# Patient Record
Sex: Female | Born: 1990 | Race: White | Hispanic: No | Marital: Single | Smoking: Former smoker
Health system: Southern US, Community
[De-identification: ages and names within clinical notes are randomized; demographics above are authoritative.]

## PROBLEM LIST (undated history)

## (undated) DIAGNOSIS — Z8489 Family history of other specified conditions: Secondary | ICD-10-CM

## (undated) DIAGNOSIS — J309 Allergic rhinitis, unspecified: Secondary | ICD-10-CM

## (undated) DIAGNOSIS — F329 Major depressive disorder, single episode, unspecified: Secondary | ICD-10-CM

## (undated) DIAGNOSIS — F32A Depression, unspecified: Secondary | ICD-10-CM

## (undated) DIAGNOSIS — N83209 Unspecified ovarian cyst, unspecified side: Secondary | ICD-10-CM

## (undated) DIAGNOSIS — E785 Hyperlipidemia, unspecified: Secondary | ICD-10-CM

## (undated) DIAGNOSIS — K219 Gastro-esophageal reflux disease without esophagitis: Secondary | ICD-10-CM

## (undated) DIAGNOSIS — F988 Other specified behavioral and emotional disorders with onset usually occurring in childhood and adolescence: Secondary | ICD-10-CM

## (undated) DIAGNOSIS — R569 Unspecified convulsions: Secondary | ICD-10-CM

## (undated) DIAGNOSIS — F419 Anxiety disorder, unspecified: Secondary | ICD-10-CM

## (undated) DIAGNOSIS — F209 Schizophrenia, unspecified: Secondary | ICD-10-CM

## (undated) HISTORY — DX: Major depressive disorder, single episode, unspecified: F32.9

## (undated) HISTORY — DX: Allergic rhinitis, unspecified: J30.9

## (undated) HISTORY — DX: Other specified behavioral and emotional disorders with onset usually occurring in childhood and adolescence: F98.8

## (undated) HISTORY — PX: NO PAST SURGERIES: SHX2092

## (undated) HISTORY — DX: Anxiety disorder, unspecified: F41.9

## (undated) HISTORY — DX: Depression, unspecified: F32.A

## (undated) HISTORY — PX: MRI: SHX5353

---
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
## 2008-05-30 ENCOUNTER — Emergency Department (HOSPITAL_COMMUNITY): Admission: EM | Admit: 2008-05-30 | Discharge: 2008-05-30 | Payer: Self-pay | Admitting: Emergency Medicine

## 2009-05-11 ENCOUNTER — Emergency Department (HOSPITAL_COMMUNITY): Admission: EM | Admit: 2009-05-11 | Discharge: 2009-05-11 | Payer: Self-pay | Admitting: Emergency Medicine
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
## 2010-06-11 ENCOUNTER — Encounter: Payer: Self-pay | Admitting: Pediatrics

## 2010-08-06 ENCOUNTER — Emergency Department (HOSPITAL_COMMUNITY)
Admission: EM | Admit: 2010-08-06 | Discharge: 2010-08-07 | Disposition: A | Discharge status: Home / Self Care | Payer: Managed Care, Other (non HMO) | Attending: Emergency Medicine | Admitting: Emergency Medicine

## 2010-08-06 DIAGNOSIS — IMO0001 Reserved for inherently not codable concepts without codable children: Secondary | ICD-10-CM | POA: Insufficient documentation

## 2010-08-06 DIAGNOSIS — R112 Nausea with vomiting, unspecified: Secondary | ICD-10-CM | POA: Insufficient documentation

## 2010-08-06 DIAGNOSIS — F341 Dysthymic disorder: Secondary | ICD-10-CM | POA: Insufficient documentation

## 2010-08-06 LAB — POCT I-STAT, CHEM 8
BUN: 16 mg/dL (ref 6–23)
Calcium, Ion: 1.09 mmol/L — ABNORMAL LOW (ref 1.12–1.32)
Chloride: 105 mEq/L (ref 96–112)
Creatinine, Ser: 0.7 mg/dL (ref 0.4–1.2)
Glucose, Bld: 116 mg/dL — ABNORMAL HIGH (ref 70–99)
HCT: 47 % — ABNORMAL HIGH (ref 36.0–46.0)
Hemoglobin: 16 g/dL — ABNORMAL HIGH (ref 12.0–15.0)
Potassium: 4 mEq/L (ref 3.5–5.1)
Sodium: 139 mEq/L (ref 135–145)
TCO2: 22 mmol/L (ref 0–100)

## 2010-08-06 LAB — URINALYSIS, ROUTINE W REFLEX MICROSCOPIC
Bilirubin Urine: NEGATIVE
Glucose, UA: NEGATIVE mg/dL
Hgb urine dipstick: NEGATIVE
Ketones, ur: NEGATIVE mg/dL
Nitrite: NEGATIVE
Protein, ur: NEGATIVE mg/dL
Specific Gravity, Urine: 1.022 (ref 1.005–1.030)
Urobilinogen, UA: 0.2 mg/dL (ref 0.0–1.0)
pH: 5 (ref 5.0–8.0)

## 2010-08-06 LAB — PREGNANCY, URINE: Preg Test, Ur: NEGATIVE

## 2010-08-06 LAB — URINE MICROSCOPIC-ADD ON

## 2010-08-22 LAB — URINALYSIS, ROUTINE W REFLEX MICROSCOPIC
Bilirubin Urine: NEGATIVE
Glucose, UA: NEGATIVE mg/dL
Ketones, ur: NEGATIVE mg/dL
Nitrite: NEGATIVE
Protein, ur: NEGATIVE mg/dL
Specific Gravity, Urine: 1.011 (ref 1.005–1.030)
Urobilinogen, UA: 0.2 mg/dL (ref 0.0–1.0)
pH: 6.5 (ref 5.0–8.0)

## 2010-08-22 LAB — URINE MICROSCOPIC-ADD ON

## 2010-08-22 LAB — POCT PREGNANCY, URINE: Preg Test, Ur: NEGATIVE

## 2010-09-05 LAB — URINE MICROSCOPIC-ADD ON

## 2010-09-05 LAB — URINALYSIS, ROUTINE W REFLEX MICROSCOPIC
Glucose, UA: NEGATIVE mg/dL
Ketones, ur: 40 mg/dL — AB
Leukocytes, UA: NEGATIVE
Nitrite: NEGATIVE
Protein, ur: 30 mg/dL — AB
Specific Gravity, Urine: 1.037 — ABNORMAL HIGH (ref 1.005–1.030)
Urobilinogen, UA: 0.2 mg/dL (ref 0.0–1.0)
pH: 5.5 (ref 5.0–8.0)

## 2010-09-05 LAB — PREGNANCY, URINE: Preg Test, Ur: NEGATIVE

## 2013-10-08 ENCOUNTER — Encounter (HOSPITAL_COMMUNITY): Payer: Self-pay | Admitting: Emergency Medicine

## 2013-10-08 ENCOUNTER — Emergency Department (HOSPITAL_COMMUNITY)
Admission: EM | Admit: 2013-10-08 | Discharge: 2013-10-08 | Disposition: A | Discharge status: Home / Self Care | Payer: Managed Care, Other (non HMO) | Attending: Emergency Medicine | Admitting: Emergency Medicine

## 2013-10-08 ENCOUNTER — Emergency Department (HOSPITAL_COMMUNITY)
Admission: EM | Admit: 2013-10-08 | Discharge: 2013-10-08 | Discharge status: Against Medical Advice | Payer: Managed Care, Other (non HMO) | Attending: Emergency Medicine | Admitting: Emergency Medicine

## 2013-10-08 DIAGNOSIS — Z792 Long term (current) use of antibiotics: Secondary | ICD-10-CM | POA: Insufficient documentation

## 2013-10-08 DIAGNOSIS — F411 Generalized anxiety disorder: Secondary | ICD-10-CM | POA: Insufficient documentation

## 2013-10-08 DIAGNOSIS — H9209 Otalgia, unspecified ear: Secondary | ICD-10-CM

## 2013-10-08 DIAGNOSIS — H6090 Unspecified otitis externa, unspecified ear: Secondary | ICD-10-CM

## 2013-10-08 DIAGNOSIS — F17200 Nicotine dependence, unspecified, uncomplicated: Secondary | ICD-10-CM | POA: Insufficient documentation

## 2013-10-08 DIAGNOSIS — H9319 Tinnitus, unspecified ear: Secondary | ICD-10-CM | POA: Insufficient documentation

## 2013-10-08 DIAGNOSIS — H60399 Other infective otitis externa, unspecified ear: Secondary | ICD-10-CM | POA: Insufficient documentation

## 2013-10-08 MED ORDER — ANTIPYRINE-BENZOCAINE 5.4-1.4 % OT SOLN: 3.0000 [drp] | OTIC | Status: DC | PRN

## 2013-10-08 MED ORDER — NEOMYCIN-POLYMYXIN-HC 3.5-10000-1 OT SUSP: 4.0000 [drp] | Freq: Three times a day (TID) | OTIC | Status: DC

## 2013-10-08 NOTE — ED Provider Notes (Signed)
CSN: [id number]     Arrival date & time 10/08/13  2030 History  This chart was scribed for non-physician practitioner Earley FavorGail Schulz, NP,  working with Layla MawKristen N Ward, DO, by Yevette EdwardsAngela Bracken, ED Scribe. This patient was seen in room [facility]1/[facility]1 and the patient's care was started at 9:24 PM.   First MD Initiated Contact with Patient 10/08/13 2124     Chief Complaint  Patient presents with  . Otalgia    The history is provided by the patient. No language interpreter was used.   HPI Comments: Margaret Farley is a 23 y.o. female who presents to the Emergency Department complaining of left-sided otalgia. She was treated in the ED previously today for the same symptoms; her ear was irrigated and she was prescribed medication. The pt returns requesting an x-ray of her ear.   History reviewed. No pertinent past medical history. History reviewed. No pertinent past surgical history. No family history on file. History  Substance Use Topics  . Smoking status: Current Every Day Smoker -- 0.50 packs/day    Types: Cigarettes  . Smokeless tobacco: Not on file  . Alcohol Use: No   No OB history provided.  Review of Systems  HENT: Positive for ear pain. Negative for ear discharge.     Allergies  Review of patient's allergies indicates no known allergies.  Home Medications   Prior to Admission medications   Medication Sig Start Date End Date Taking? Authorizing Provider  antipyrine-benzocaine [name](AURALGAN) otic solution Place 3-4 drops into the left ear every 2 (two) hours as needed for ear pain. 10/08/13   Renne CriglerJoshua Geiple, PA-C  neomycin-polymyxin-hydrocortisone (CORTISPORIN) 3.5-10000-1 otic suspension Place 4 drops into the left ear 3 (three) times daily. 10/08/13   Renne CriglerJoshua Geiple, PA-C   Triage Vitals: BP 137/87  Pulse 97  Temp(Src) 98.3 F (36.8 C) (Oral)  Resp 16  SpO2 99%  LMP 09/14/2013  Physical Exam  Nursing note and vitals reviewed. Constitutional: She is oriented to person, place, and  time. She appears well-developed and well-nourished. No distress.  HENT:  Head: Normocephalic and atraumatic.  Right Ear: External ear normal. No drainage, swelling or tenderness. No foreign bodies. No mastoid tenderness. Tympanic membrane is not injected and not perforated.  Left Ear: External ear normal. No drainage, swelling or tenderness. No foreign bodies. No mastoid tenderness. Tympanic membrane is not injected, not perforated, not erythematous, not retracted and not bulging.  Eyes: EOM are normal.  Neck: Neck supple. No tracheal deviation present.  Cardiovascular: Normal rate.   Pulmonary/Chest: Effort normal. No respiratory distress.  Musculoskeletal: Normal range of motion.  Neurological: She is alert and oriented to person, place, and time.  Skin: Skin is warm and dry.  Psychiatric: She has a normal mood and affect. Her behavior is normal.    ED Course  Procedures (including critical care time)  DIAGNOSTIC STUDIES: Oxygen Saturation is 99% on room air, normal by my interpretation.    COORDINATION OF CARE:  9:28 PM- Discussed treatment plan with patient, and the pt expressed frustration that her ear would not be imaged. She became angry during the interview when it was explained to her that her symptoms did not warrant imaging.   9:30 PM- The pt's mother requested a psych consult for the pt, but the pt left before treatment.   Labs Review Labs Reviewed - No data to display  Imaging Review No results found.   EKG Interpretation None      MDM   Final diagnoses:  Otalgia       I personally performed the services described in this documentation, which was scribed in my presence. The recorded information has been reviewed and is accurate.     Arman FilterGail K Schulz, NP 10/08/13 2136  Arman FilterGail K Schulz, NP 10/08/13 2136

## 2013-10-08 NOTE — ED Provider Notes (Signed)
Medical screening examination/treatment/procedure(s) were performed by non-physician practitioner and as supervising physician I was immediately available for consultation/collaboration.   EKG Interpretation None       Kristen N Ward, DO 10/08/13 2325 

## 2013-10-08 NOTE — ED Notes (Signed)
Per pt report: pt was seen earlier for same complaint. Pt is here for an x-ray.  Pt reports there is something in left ear. Pt a/o x 4.  Skin warm and dry. NAD noted.  Ambulatory in triage.

## 2013-10-08 NOTE — ED Notes (Signed)
Pt left prior to receiving any discharge instructions.

## 2013-10-08 NOTE — ED Notes (Signed)
Pt states she has had ringing in bilateral ears x 3 wks.  Thinks there may be something in them.  Has used tweezers and dunked her head underwater to try to alleviate it.

## 2013-10-08 NOTE — Discharge Instructions (Signed)
Please read and follow all provided instructions.  Your diagnoses today include:  1. Otitis externa    Tests performed today include:  Vital signs. See below for your results today.   Medications prescribed:   Antipyrine/Benzocaine eardrops - Use 2-4 drops in painful ear every 6 hours as needed   Cortisporin ear drops - drops for external ear infection  Take any prescribed medications only as directed. Treatment for your infection is aimed at treating the symptoms. There are no medications, such as antibiotics, that will cure your infection.   Home care instructions:  Follow any educational materials contained in this packet.   Your illness is contagious and can be spread to others, especially during the first 3 or 4 days. It cannot be cured by antibiotics or other medicines. Take basic precautions such as washing your hands often, covering your mouth when you cough or sneeze, and avoiding public places where you could spread your illness to others.   Please continue drinking plenty of fluids.  Use over-the-counter medicines as needed as directed on packaging for symptom relief.  You may also use ibuprofen or tylenol as directed on packaging for pain or fever.  Do not take multiple medicines containing Tylenol or acetaminophen to avoid taking too much of this medication.  Follow-up instructions: Please follow-up with your primary care provider or the ear doctor listed in the next 7 days for further evaluation of your symptoms if you are not feeling better. If you do not have a primary care doctor -- see below for referral information.   Return instructions:   Please return to the Emergency Department if you experience worsening symptoms.   RETURN IMMEDIATELY IF you develop shortness of breath, confusion or altered mental status, a new rash, become dizzy, faint, or poorly responsive, or are unable to be cared for at home.  Please return if you have persistent vomiting and cannot keep  down fluids or develop a fever that is not controlled by tylenol or motrin.    Please return if you have any other emergent concerns.  Additional Information:  Your vital signs today were: BP 130/83   Pulse 79   Temp(Src) 98.3 F (36.8 C) (Oral)   Resp 20   SpO2 100% If your blood pressure (BP) was elevated above 135/85 this visit, please have this repeated by your doctor within one month. --------------

## 2013-10-08 NOTE — ED Provider Notes (Signed)
CSN: [id number]     Arrival date & time 10/08/13  1554 History   First MD Initiated Contact with Patient 10/08/13 1733     Chief Complaint  Patient presents with  . Ear Problem     (Consider location/radiation/quality/duration/timing/severity/associated sxs/prior Treatment) HPI Comments: Patient presents with complaint of tinnitus, left ear pain and foreign body sensation since the last week of February 2015. Patient denies injury or getting anything in her ear. She doesn't know what could be and then. Patient denies to me, any actions to improve the symptoms. She denies using Q-tips or putting anything in her ears because she is afraid. She denies URI symptoms, fever. Onset of symptoms gradual. Course is gradually worsening. Nothing makes symptoms better or worse.  The history is provided by the patient.    History reviewed. No pertinent past medical history. No past surgical history on file. No family history on file. History  Substance Use Topics  . Smoking status: Not on file  . Smokeless tobacco: Not on file  . Alcohol Use: Not on file   OB History   Grav Para Term Preterm Abortions TAB SAB Ect Mult Living                 Review of Systems  Constitutional: Negative for fever and chills.  HENT: Positive for ear pain and tinnitus. Negative for congestion, ear discharge, rhinorrhea, sinus pressure and sore throat.   Eyes: Negative for redness.  Gastrointestinal: Negative for nausea and vomiting.  Musculoskeletal: Negative for neck stiffness.  Skin: Negative for rash.  Neurological: Negative for headaches.  Hematological: Negative for adenopathy.   Allergies  Review of patient's allergies indicates no known allergies.  Home Medications   Prior to Admission medications   Not on File   BP 130/83  Pulse 79  Temp(Src) 98.3 F (36.8 C) (Oral)  Resp 20  SpO2 100%  Physical Exam  Nursing note and vitals reviewed. Constitutional: She appears well-developed and  well-nourished.  HENT:  Head: Normocephalic and atraumatic.  Right Ear: Tympanic membrane and ear canal normal. There is tenderness. No drainage or swelling. No foreign bodies. Tympanic membrane is not injected, not perforated and not erythematous. No decreased hearing is noted.  Left Ear: Tympanic membrane and ear canal normal. There is tenderness. No drainage or swelling. No foreign bodies. Tympanic membrane is not injected, not perforated and not erythematous. Decreased hearing is noted.  Nose: Nose normal. No mucosal edema or rhinorrhea.  Mouth/Throat: Uvula is midline, oropharynx is clear and moist and mucous membranes are normal. Mucous membranes are not dry. No oral lesions. No trismus in the jaw. No uvula swelling. No oropharyngeal exudate, posterior oropharyngeal edema, posterior oropharyngeal erythema or tonsillar abscesses.  Mild wax in canals bilaterally. Pt is very anxious and pulls away from speculum during exam. Mild tenderness when pulling L pinna. Mild flaking and erythema L ear canal. No mastoid tenderness or erythema on either side.   Eyes: Conjunctivae are normal. Right eye exhibits no discharge. Left eye exhibits no discharge.  Neck: Normal range of motion. Neck supple.  Cardiovascular: Normal rate, regular rhythm and normal heart sounds.   Pulmonary/Chest: Effort normal and breath sounds normal. No respiratory distress. She has no wheezes. She has no rales.  Abdominal: Soft. There is no tenderness.  Lymphadenopathy:    She has no cervical adenopathy.  Neurological: She is alert.  Skin: Skin is warm and dry.  Psychiatric: Her mood appears anxious.    ED Course  Procedures (  including critical care time) Labs Review Labs Reviewed - No data to display  Imaging Review No results found.   EKG Interpretation None      5:39 PM Patient seen and examined. Nurse to gently irrigate ear to improve view. I do not see any FB. There is flaking and mild erythema L ear canal,  question otitis externa.    Vital signs reviewed and are as follows: Filed Vitals:   10/08/13 1623  BP: 130/83  Pulse: 79  Temp: 98.3 F (36.8 C)  Resp: 20   7:03 PM re-exam after irrigation by nurse reveals clear canals with scant amount of cerumen bilaterally. Again, question mild chronic otitis externa.   We discussed that there is no foreign body in canal. Patient is adamant there is something in her ear. I explained that this sensation is likely due to mild external otitis and I would like to treat her for this. I offered to give drops in ED to take home. She refuses stating that she will not use them. I wrote her a prescription in case she changes her mind.   She does however accept ENT referral. I asked her to follow-up in 1 week if she is not feeling better.   Patient urged to return with worsening symptoms or other concerns. Patient verbalized understanding and agrees with plan.   I was called into patient room prior to her departure. She insisted on taking my name. I explained this was listed on discharge paperwork.    MDM   Final diagnoses:  Otitis externa   Patient with FB sensation in ear. She has no FB noted in either canal after irrigation. Her behavior is somewhat bizarre. She is insistent that something is in her ear. She refuses other treatment from me today. She does not appear to be a threat to herself or others. Her tinnitus and FB sensation will be able to be followed as outpatient.     Renne CriglerJoshua Geiple, PA-C 10/08/13 1911

## 2013-10-08 NOTE — ED Notes (Signed)
Pt ambulatory to exam room with steady gait.  

## 2013-10-08 NOTE — ED Provider Notes (Signed)
Medical screening examination/treatment/procedure(s) were performed by non-physician practitioner and as supervising physician I was immediately available for consultation/collaboration.   EKG Interpretation None       Layla MawKristen N Ward, DO 10/08/13 2325

## 2013-10-14 ENCOUNTER — Emergency Department (HOSPITAL_COMMUNITY)
Admission: EM | Admit: 2013-10-14 | Discharge: 2013-10-14 | Disposition: A | Discharge status: Home / Self Care | Payer: Managed Care, Other (non HMO) | Attending: Emergency Medicine | Admitting: Emergency Medicine

## 2013-10-14 ENCOUNTER — Encounter (HOSPITAL_COMMUNITY): Payer: Self-pay | Admitting: Emergency Medicine

## 2013-10-14 DIAGNOSIS — IMO0002 Reserved for concepts with insufficient information to code with codable children: Secondary | ICD-10-CM | POA: Insufficient documentation

## 2013-10-14 DIAGNOSIS — F419 Anxiety disorder, unspecified: Secondary | ICD-10-CM

## 2013-10-14 DIAGNOSIS — F410 Panic disorder [episodic paroxysmal anxiety] without agoraphobia: Secondary | ICD-10-CM | POA: Insufficient documentation

## 2013-10-14 DIAGNOSIS — F3289 Other specified depressive episodes: Secondary | ICD-10-CM | POA: Insufficient documentation

## 2013-10-14 DIAGNOSIS — F32A Depression, unspecified: Secondary | ICD-10-CM

## 2013-10-14 DIAGNOSIS — Z792 Long term (current) use of antibiotics: Secondary | ICD-10-CM | POA: Insufficient documentation

## 2013-10-14 DIAGNOSIS — F17200 Nicotine dependence, unspecified, uncomplicated: Secondary | ICD-10-CM | POA: Insufficient documentation

## 2013-10-14 DIAGNOSIS — F329 Major depressive disorder, single episode, unspecified: Secondary | ICD-10-CM | POA: Insufficient documentation

## 2013-10-14 NOTE — ED Notes (Signed)
MD bedside
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
## 2013-10-14 NOTE — Discharge Instructions (Signed)
Depression, Adult °Depression refers to feeling sad, low, down in the dumps, blue, gloomy, or empty. In general, there are two kinds of depression: °1. Depression that we all experience from time to time because of upsetting life experiences, including the loss of a job or the ending of a relationship (normal sadness or normal grief). This kind of depression is considered normal, is short lived, and resolves within a few days to 2 weeks. (Depression experienced after the loss of a loved one is called bereavement. Bereavement often lasts longer than 2 weeks but normally gets better with time.) °2. Clinical depression, which lasts longer than normal sadness or normal grief or interferes with your ability to function at home, at work, and in school. It also interferes with your personal relationships. It affects almost every aspect of your life. Clinical depression is an illness. °Symptoms of depression also can be caused by conditions other than normal sadness and grief or clinical depression. Examples of these conditions are listed as follows: °· Physical illness Some physical illnesses, including underactive thyroid gland (hypothyroidism), severe anemia, specific types of cancer, diabetes, uncontrolled seizures, heart and lung problems, strokes, and chronic pain are commonly associated with symptoms of depression. °· Side effects of some prescription medicine In some people, certain types of prescription medicine can cause symptoms of depression. °· Substance abuse Abuse of alcohol and illicit drugs can cause symptoms of depression. °SYMPTOMS °Symptoms of normal sadness and normal grief include the following: °· Feeling sad or crying for short periods of time. °· Not caring about anything (apathy). °· Difficulty sleeping or sleeping too much. °· No longer able to enjoy the things you used to enjoy. °· Desire to be by oneself all the time (social isolation). °· Lack of energy or motivation. °· Difficulty  concentrating or remembering. °· Change in appetite or weight. °· Restlessness or agitation. °Symptoms of clinical depression include the same symptoms of normal sadness or normal grief and also the following symptoms: °· Feeling sad or crying all the time. °· Feelings of guilt or worthlessness. °· Feelings of hopelessness or helplessness. °· Thoughts of suicide or the desire to harm yourself (suicidal ideation). °· Loss of touch with reality (psychotic symptoms). Seeing or hearing things that are not real (hallucinations) or having false beliefs about your life or the people around you (delusions and paranoia). °DIAGNOSIS  °The diagnosis of clinical depression usually is based on the severity and duration of the symptoms. Your caregiver also will ask you questions about your medical history and substance use to find out if physical illness, use of prescription medicine, or substance abuse is causing your depression. Your caregiver also may order blood tests. °TREATMENT  °Typically, normal sadness and normal grief do not require treatment. However, sometimes antidepressant medicine is prescribed for bereavement to ease the depressive symptoms until they resolve. °The treatment for clinical depression depends on the severity of your symptoms but typically includes antidepressant medicine, counseling with a mental health professional, or a combination of both. Your caregiver will help to determine what treatment is best for you. °Depression caused by physical illness usually goes away with appropriate medical treatment of the illness. If prescription medicine is causing depression, talk with your caregiver about stopping the medicine, decreasing the dose, or substituting another medicine. °Depression caused by abuse of alcohol or illicit drugs abuse goes away with abstinence from these substances. Some adults need professional help in order to stop drinking or using drugs. °SEEK IMMEDIATE CARE IF: °· You have   thoughts  about hurting yourself or others. °· You lose touch with reality (have psychotic symptoms). °· You are taking medicine for depression and have a serious side effect. °FOR MORE INFORMATION °National Alliance on Mental Illness: www.nami.org  °National Institute of Mental Health: www.nimh.nih.gov  °Document Released: 05/05/2000 Document Revised: 11/07/2011 Document Reviewed: 08/07/2011 °ExitCare® Patient Information ©2014 [employer]. ° °Panic Attacks °Panic attacks are sudden, short-lived surges of severe anxiety, fear, or discomfort. They may occur for no reason when you are relaxed, when you are anxious, or when you are sleeping. Panic attacks may occur for a number of reasons:  °· Healthy people occasionally have panic attacks in extreme, life-threatening situations, such as war or natural disasters. Normal anxiety is a protective mechanism of the body that helps us react to danger (fight or flight response). °· Panic attacks are often seen with anxiety disorders, such as panic disorder, social anxiety disorder, generalized anxiety disorder, and phobias. Anxiety disorders cause excessive or uncontrollable anxiety. They may interfere with your relationships or other life activities. °· Panic attacks are sometimes seen with other mental illnesses such as depression and posttraumatic stress disorder. °· Certain medical conditions, prescription medicines, and drugs of abuse can cause panic attacks. °SYMPTOMS  °Panic attacks start suddenly, peak within 20 minutes, and are accompanied by four or more of the following symptoms: °· Pounding heart or fast heart rate (palpitations). °· Sweating. °· Trembling or shaking. °· Shortness of breath or feeling smothered. °· Feeling choked. °· Chest pain or discomfort. °· Nausea or strange feeling in your stomach. °· Dizziness, lightheadedness, or feeling like you will faint. °· Chills or hot flushes. °· Numbness or tingling in your lips or hands and feet. °· Feeling that things  are not real or feeling that you are not yourself. °· Fear of losing control or going crazy. °· Fear of dying. °Some of these symptoms can mimic serious medical conditions. For example, you may think you are having a heart attack. Although panic attacks can be very scary, they are not life threatening. °DIAGNOSIS  °Panic attacks are diagnosed through an assessment by your health care provider. Your health care provider will ask questions about your symptoms, such as where and when they occurred. Your health care provider will also ask about your medical history and use of alcohol and drugs, including prescription medicines. Your health care provider may order blood tests or other studies to rule out a serious medical condition. Your health care provider may refer you to a mental health professional for further evaluation. °TREATMENT  °· Most healthy people who have one or two panic attacks in an extreme, life-threatening situation will not require treatment. °· The treatment for panic attacks associated with anxiety disorders or other mental illness typically involves counseling with a mental health professional, medicine, or a combination of both. Your health care provider will help determine what treatment is best for you. °· Panic attacks due to physical illness usually goes away with treatment of the illness. If prescription medicine is causing panic attacks, talk with your health care provider about stopping the medicine, decreasing the dose, or substituting another medicine. °· Panic attacks due to alcohol or drug abuse goes away with abstinence. Some adults need professional help in order to stop drinking or using drugs. °HOME CARE INSTRUCTIONS  °· Take all your medicines as prescribed.   °· Check with your health care provider before starting new prescription or over-the-counter medicines. °· Keep all follow up appointments with your health care provider. °SEEK MEDICAL   CARE IF: °· You are not able to take  your medicines as prescribed. °· Your symptoms do not improve or get worse. °SEEK IMMEDIATE MEDICAL CARE IF:  °· You experience panic attack symptoms that are different than your usual symptoms. °· You have serious thoughts about hurting yourself or others. °· You are taking medicine for panic attacks and have a serious side effect. °MAKE SURE YOU: °· Understand these instructions. °· Will watch your condition. °· Will get help right away if you are not doing well or get worse. °Document Released: 05/08/2005 Document Revised: 02/26/2013 Document Reviewed: 12/20/2012 °ExitCare® Patient Information ©2014 ExitCare[employer] LLC. ° ° °Emergency Department Resource Guide °1) Find a Doctor and Pay Out of Pocket °Although you won't have to find out who is covered by your insurance plan, it is a good idea to ask around and get recommendations. You will then need to call the office and see if the doctor you have chosen will accept you as a new patient and what types of options they offer for patients who are self-pay. Some doctors offer discounts or will set up payment plans for their patients who do not have insurance, but you will need to ask so you aren't surprised when you get to your appointment. ° °2) Contact Your Local Health Department °Not all health departments have doctors that can see patients for sick visits, but many do, so it is worth a call to see if yours does. If you don't know where your local health department is, you can check in your phone book. The CDC also has a tool to help you locate your state's health department, and many state websites also have listings of all of their local health departments. ° °3) Find a Walk-in Clinic °If your illness is not likely to be very severe or complicated, you may want to try a walk in clinic. These are popping up all over the country in pharmacies, drugstores, and shopping centers. They're usually staffed by nurse practitioners or physician assistants that have been trained  to treat common illnesses and complaints. They're usually fairly quick and inexpensive. However, if you have serious medical issues or chronic medical problems, these are probably not your best option. ° °No Primary Care Doctor: °- Call [facility] at  832-8000 - they can help you locate a primary care doctor that  accepts your insurance, provides certain services, etc. °- Physician Referral Service- 1-800-533-3463 ° °Chronic Pain Problems: °Organization         Address  Phone   Notes  °[facility]  (336) 297-2271 Patients need to be referred by their primary care doctor.  ° °Medication Assistance: °Organization         Address  Phone   Notes  °[place] [facility] [address] °[place], [place] [address] (336) 641-8030 --Must be a resident of [place] °-- Must have NO insurance coverage whatsoever (no Medicaid/ Medicare, etc.) °-- The pt. MUST have a primary care doctor that directs their care regularly and follows them in the community °  °[facility]  (866) 331-1348   °[employer]  (888) 892-1162   ° °Agencies that provide inexpensive medical care: °Organization         Address  Phone   Notes  °[facility]  (336) 832-8035   °[facility]    (336) 832-7272   °[facility] [address] °[place], [place] [address] (336) 832-4777   °  [facility] [address], °[place] (336) 271-4999   °[facility]    (336) 373-0678   °[facility]    (336) 272-1050   °[facility] ° [address], [place] Phone:  (336) 832-4444, Fax:  (336) 832-4440 Hours of Operation:  9 am - 6 pm, M-F.  Also accepts Medicaid/Medicare and self-pay.  °[facility] ° [address], [place] Phone: (336) [phone], Fax: (336) [phone]. Hours of Operation:  8:30 am - 5:30 pm, M-F.  Also accepts Medicaid and self-pay.   °[facility] [place] [address], [place] Phone: (336) 878-6027   °[facility] [address], [place], [place] (336)723-1848, Ext. 123 Mondays & Thursdays: 7-9 AM.  First 15 patients are seen on a first come, first serve basis. °  ° °Medicaid-accepting [place] Providers: ° °Organization         Address  Phone   Notes  °[facility] [address], [place] (336) 641-2100 Also accepts self-pay patients.  °[facility] [address], [place] ° (336) 856-9996   °[facility] [address], [place] (336) 288-8857   °[facility] [address] [place] [address], [place] (336) 299-7000   °Veita Bland [address], [place]  ° (336) 373-1557 Only accepts [place] Access Medicaid patients after they have their name applied to their card.  ° °Self-Pay (no insurance) in [place]: ° °Organization         Address  Phone   Notes  °Sickle Cell Patients, [facility] [address], [place] (336) 832-1970   °[facility] [address], [place] (336) 832-4400   °[facility] ° [address], [place] (336) 992-4800   °[facility]/Dr. Osei-Bonsu ° [address] [place] [address], [place] or [address], [place] (336) [phone] Phone number for both [place] and [place] locations is the same.  °[facility] [address], [place] (336) 299-0000   °[facility] [address] [place] [address], [place] or [address] (336) 852-7530 °(336) 878-2260   °[facility] [address], [place] (336) 350-1642, phone; (336) 294-5005, fax Sees patients 1st and 3rd Saturday of every month.  Must not qualify for public or private insurance (i.e. Medicaid, Medicare, [place] Health Choice, Veterans' Benefits) • Household income should be no  more than 200% of the poverty level •The clinic cannot treat you if you are pregnant or think you are pregnant • Sexually transmitted diseases are not treated at the clinic.  ° ° °Dental Care: °Organization         Address  Phone  Notes  °[facility] [address],  (336) 641-6152 Accepts children up to age 21 who are enrolled in Medicaid or [place] Health Choice; pregnant women with a Medicaid card; and children who have applied for Medicaid or [place] Health Choice, but were declined, whose parents can pay a reduced fee at time of service.  °[facility] [place]  [address], [place] (336) 641-7733 Accepts children up to age 21 who are enrolled in Medicaid or  Health Choice; pregnant women with a Medicaid card; and   children who have applied for Medicaid or [place] Health Choice, but were declined, whose parents can pay a reduced fee at time of service.  °[facility] ° [address], [place] (336) 641-4533 Patients are seen by appointment only. Walk-ins are not accepted. [facility] will see patients 18 years of age and older. °Monday - Tuesday (8am-5pm) °Most Wednesdays (8:30-5pm) °$30 per visit, cash only  °[facility] ° [address], [place] (336) 641-4533 Patients are seen by appointment only. Walk-ins are not accepted. [facility] will see patients 18 years of age and older. °One Wednesday Evening (Monthly: Volunteer Based).  $30 per visit, cash only  °[facility]  (919) 537-3737 for adults; Children under age 4, call Graduate Pediatric Dentistry at ([phone]) [phone]. Children aged 4-14, please call ([phone]) [phone] to request a pediatric application. ° Dental services are provided in all areas of dental care including fillings, crowns and bridges, complete and partial dentures, implants, gum treatment, root canals,  and extractions. Preventive care is also provided. Treatment is provided to both adults and children. °Patients are selected via a lottery and there is often a waiting list. °  °[facility] [address], °[place] ° (336) 763-8833 [email or web address] °  °[facility] [address], [place], [place] (336)723-1848, Ext. 123 Second and Fourth Thursday of each month, opens at 6:30 AM; Clinic ends at 9 AM.  Patients are seen on a first-come first-served basis, and a limited number are seen during each clinic.  ° °[facility] ° [address], [place], [place] (336) 723-7904   Eligibility Requirements °You must have lived in [place], [place], or [place] counties for at least the last three months. °  You cannot be eligible for state or federal sponsored healthcare insurance, including Veterans Administration, Medicaid, or Medicare. °  You generally cannot be eligible for healthcare insurance through your employer.  °  How to apply: °Eligibility screenings are held every Tuesday and Wednesday afternoon from 1:00 pm until 4:00 pm. You do not need an appointment for the interview!  °[facility] [address], [place], [place] 336-631-2330   °[facility]  336-342-8273   °[facility]  336-703-3100   °[facility]  336-570-6415   ° °Behavioral Health Resources in the Community: °Intensive Outpatient Programs °Organization         Address  Phone  Notes  °[facility] [address], [place], [place] 336-878-6098   °[facility] [address], [place], [place] 336-832-9800   °[facility] [address], [place], [place] ° 336-882-2125   °[facility] [address],  °[place], [place] 1-800-853-5163 or 336-641-4981   °Substance Abuse Resources °Organization         Address  Phone  Notes  °[facility]  336-882-2125    °[facility]  336-784-9470   °[facility]  336-285-9073   °[facility]  336-845-3988   °Residential & Outpatient Substance Abuse Program  1-800-659-3381   °Psychological Services °Organization         Address  Phone  Notes  °[facility]  336- 832-9600   °[facility]  336- 378-7881   °[facility] [address], [place] 1-800-853-5163 or 336-641-4981   ° °Mobile Crisis Teams °Organization           Address  Phone  Notes  °[facility], Mobile Crisis Care Unit  1-877-626-1772   °Assertive °[facility] ° [address] [place], [place] 336-834-9664   °Sharon DeEsch [address] °[place] 336-554-5454   ° °Self-Help/Support Groups °Organization         Address  Phone             Notes  °[facility] - variety of support groups  336- 373-1402 Call for more information  °Narcotics Anonymous (NA), [facility] [address], °[place]  2 meetings at this location  ° °Residential Treatment Programs °Organization         Address  Phone  Notes  °[facility] [address],    °[place]  1-866-801-8205   °[facility] ° [address], [place], [place] 704-293-8524   °[facility] [address], [place] 336-845-3988 Admissions: 8am-3pm M-F  °[facility] [address]    °[place], [place] 336-841-1104   °[facility] [address], [place], [place] 336-379-7146   °[facility] [address]  °[place] 336-285-9073   °[facility] Intensive Outpatient [address], [place], [place] 336-852-3033   °[facility] [address]  °[place], [place] 1-877-615-2722 or 336-784-9470   °[facility] [address] [place], [place] 336-227-7417 Accepts Medicaid  °[facility] [address]   ° [place] 1-800-659-3381 Substance Abuse/Addiction Treatment  ° °[place] Behavioral Health Resources °Organization         Address  Phone  Notes  °[facility]  (888) 581-9988   °Julie Brannon, PhD [address] [place], [place]   (336) 349-5553 or (336) 951-0000   °[facility]   [address] °[place], [place] (336) 349-4454   °[facility] [address], [place], [place] (336) 342-8316 Insurance/Medicaid/sponsorship through [facility]  °[facility] [address]                                    [place], [place] (336) 342-8316 Therapy/tele-psych/case  °[facility] [address]  ° [place], [place] (336) 349-2233    °Dr. Arfeen  (336) 349-4544   °[facility] [place]  [employer] [place] [facility] 1) [address], [place] °2) [address], [place] °3)  [address], [place] (336) 349-3220 °(336) 342-7768 ° °(336) 342-8140   °[place] [facility] (336) 342-1394 or (336) 342-3537 (After Hours)    ° ° ° °

## 2013-10-14 NOTE — ED Notes (Signed)
Pt alert, nad, resp even unlabored, skin pwd, denies needs, ambulates to discharge 

## 2013-10-14 NOTE — ED Notes (Signed)
Pt approaches nurses station, pt wanting to know what is taking so long, pt redirected to room.
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
## 2013-11-20 ENCOUNTER — Encounter: Payer: Self-pay | Admitting: Family Medicine

## 2013-11-20 ENCOUNTER — Ambulatory Visit (INDEPENDENT_AMBULATORY_CARE_PROVIDER_SITE_OTHER): Payer: Managed Care, Other (non HMO) | Admitting: Family Medicine

## 2013-11-20 VITALS — BP 122/80 | HR 87 | Temp 98.2°F | Ht 62.5 in | Wt 92.0 lb

## 2013-11-20 DIAGNOSIS — Z8669 Personal history of other diseases of the nervous system and sense organs: Secondary | ICD-10-CM

## 2013-11-20 DIAGNOSIS — R51 Headache: Secondary | ICD-10-CM

## 2013-11-20 DIAGNOSIS — R636 Underweight: Secondary | ICD-10-CM

## 2013-11-20 DIAGNOSIS — F988 Other specified behavioral and emotional disorders with onset usually occurring in childhood and adolescence: Secondary | ICD-10-CM

## 2013-11-20 DIAGNOSIS — R519 Headache, unspecified: Secondary | ICD-10-CM

## 2013-11-20 DIAGNOSIS — F411 Generalized anxiety disorder: Secondary | ICD-10-CM

## 2013-11-20 LAB — CBC WITH DIFFERENTIAL/PLATELET
Basophils Absolute: 0 10*3/uL (ref 0.0–0.1)
Basophils Relative: 0.5 % (ref 0.0–3.0)
Eosinophils Absolute: 0.1 10*3/uL (ref 0.0–0.7)
Eosinophils Relative: 1.9 % (ref 0.0–5.0)
HCT: 45.2 % (ref 36.0–46.0)
Hemoglobin: 15.2 g/dL — ABNORMAL HIGH (ref 12.0–15.0)
Lymphocytes Relative: 37 % (ref 12.0–46.0)
Lymphs Abs: 2.8 10*3/uL (ref 0.7–4.0)
MCHC: 33.5 g/dL (ref 30.0–36.0)
MCV: 89.8 fl (ref 78.0–100.0)
Monocytes Absolute: 0.5 10*3/uL (ref 0.1–1.0)
Monocytes Relative: 6.6 % (ref 3.0–12.0)
Neutro Abs: 4.1 10*3/uL (ref 1.4–7.7)
Neutrophils Relative %: 54 % (ref 43.0–77.0)
Platelets: 197 10*3/uL (ref 150.0–400.0)
RBC: 5.03 Mil/uL (ref 3.87–5.11)
RDW: 13.3 % (ref 11.5–15.5)
WBC: 7.6 10*3/uL (ref 4.0–10.5)

## 2013-11-20 LAB — BASIC METABOLIC PANEL
BUN: 17 mg/dL (ref 6–23)
CO2: 29 mEq/L (ref 19–32)
Calcium: 9.3 mg/dL (ref 8.4–10.5)
Chloride: 104 mEq/L (ref 96–112)
Creatinine, Ser: 0.8 mg/dL (ref 0.4–1.2)
GFR: 95.66 mL/min (ref >60.00)
Glucose, Bld: 57 mg/dL — ABNORMAL LOW (ref 70–99)
Potassium: 3.7 mEq/L (ref 3.5–5.1)
Sodium: 140 mEq/L (ref 135–145)

## 2013-11-20 LAB — TSH: TSH: 1.02 u[IU]/mL (ref 0.35–4.50)

## 2013-11-20 LAB — T4, FREE: Free T4: 0.71 ng/dL (ref 0.60–1.60)

## 2013-11-20 NOTE — Progress Notes (Signed)
Pre visit review using our clinic review tool, if applicable. No additional management support is needed unless otherwise documented below in the visit note. 

## 2013-11-20 NOTE — Patient Instructions (Signed)
-  We have ordered labs or studies at this visit. It can take up to 1-2 weeks for results and processing. We will contact you with instructions IF your results are abnormal. Normal results will be released to your MYCHART. If you have not heard from us or can not find your results in MYCHART in 2 weeks please contact our office.  -PLEASE SIGN UP FOR MYCHART TODAY   We recommend the following healthy lifestyle measures: - eat a healthy diet consisting of lots of vegetables, fruits, beans, nuts, seeds, healthy meats such as white chicken and fish and whole grains.  - avoid fried foods, fast food, processed foods, sodas, red meet and other fattening foods.  - get a least 150 minutes of aerobic exercise per week.   Follow up in: 2 months  

## 2013-11-20 NOTE — Progress Notes (Signed)
No chief complaint on file.   HPI:  Margaret Farley is here to establish care.  Last PCP and physical: sees gyn yearly and normal  Has the following chronic problems and concerns today:  There are no active problems to display for this patient.  Hx of seizure: - hx of seizure a few years ago - walking to class and passed out and had a seizure - has never been evaluated before - wants to see neurologist - has been having headaches a few times per week since exams - law school at [employer]  Poor Weight gain Weight: -reports mother wants to check thyroid, no FH thyroid issues -denies: fevers fevers, vomiting, diarrhea, nausea, mucus in tools, hematochezia, melena, palpitations, heat or cold intolerance, irr periods, CP, SOB -it is felt that this is related to her mental status and is see a counslor but mom wanted a physical  -Sees Dr. Therapist, sportssychiatrist at school and is on Adderall, klonpin and zoloft - she denies body image issues, anorexia or bulemia. Stopped adderall a few months ago and has gained a little weight since then.  -she thinks she is eating enough - not three meals a day -walks, runs, swims on a regular basis -reports no weight loss in the last few years, but lost some weight when started aderral  Tobacco Use: -trying to cut back -thinks she can do this on her own with nicotine replacement - does not want to try patches, wellbutrin or chantix  Health Maintenance: UTD per her report  ROS: See pertinent positives and negatives per HPI.  Past Medical History  Diagnosis Date  . Depression   . Anxiety   . ADD (attention deficit disorder)     Family History  Problem Relation Age of Onset  . Heart disease Father     MI age unknown  . Cancer Maternal Grandmother     breast    History   Social History  . Marital Status: Single    Spouse Name: N/A    Number of Children: N/A  . Years of Education: N/A   Social History Main Topics  . Smoking status: Current Every Day  Smoker -- 0.50 packs/day    Types: Cigarettes  . Smokeless tobacco: None  . Alcohol Use: No  . Drug Use: No  . Sexual Activity: No   Other Topics Concern  . 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 , Disp: , Rfl: ;  clonazePAM (KLONOPIN) 0.5 MG tablet, Take 0.5 mg by mouth 3 (three) times daily as needed for anxiety., Disp: , Rfl: ;  sertraline (ZOLOFT) 100 MG tablet, Take 100 mg by mouth daily. Take 2.5 tablets daily., Disp: , Rfl:   EXAM:  Filed Vitals:   11/20/13 0813  BP: 122/80  Pulse: 87  Temp: 98.2 F (36.8 C)    Body mass index is 16.55 kg/(m^2).  GENERAL: vitals reviewed and listed above, alert, oriented, appears well hydrated and in no acute distress  HEENT: atraumatic, conjunttiva clear, no obvious abnormalities on inspection of external nose and ears  NECK: no obvious masses on inspection  LUNGS: clear to auscultation  bilaterally, no wheezes, rales or rhonchi, good air movement  CV: HRRR, no peripheral edema  MS: moves all extremities without noticeable abnormality  PSYCH: unpleasant and not very cooperative, obviously angry or upset but on questioning would not elaborate on why  ASSESSMENT AND PLAN:  Discussed the following assessment and plan:  Underweight - Plan: TSH, T4, Free, Basic metabolic panel, CBC with Differential  ADD (attention deficit disorder)  Anxiety state, unspecified  Frequent headaches - Plan: Ambulatory referral to Neurology  Hx of seizure disorder - Plan: Ambulatory referral to Neurology  Note: patient was very rude to my assistant and was uncooperative with vitals sign intake, and was rude during her history intake  -We reviewed the  PMH, PSH, FH, SH, Meds and Allergies. -We provided refills for any medications we will prescribe as needed. -We addressed current concerns per orders and patient instructions. -We have asked for records for pertinent exams, studies, vaccines and notes from previous providers. -We have advised patient to follow up per instructions below. -labs per orders, followed by psychiatrist and feel issues likely related to this -reports she has always been thin, lost some weight when started adderral, gaining since stopping per her report -advise she may want to see nutritionist to help ensure adequate calorie intake - she refused to give me diet report and reports she eats well and often and is getting plenty of calories -I specifically asked about body image concerns, restricting, binging, bulemia, anorexia and she denies all of this -for her hx of headaches and remote hx of ? seizure advised evaluation by neurologist  -Patient advised to return or notify a doctor immediately if symptoms worsen or persist or new concerns arise.  Patient Instructions  -We have ordered labs or studies at this visit. It can take up to 1-2 weeks for results and processing. We will contact you with instructions IF your results are abnormal. Normal results will be released to your MYCHART. If you have not heard [facility] us or can not find your results in [facility] in 2 weeks please contact our office.  -PLEASE SIGN UP FOR MYCHART TODAY   We recommend the following healthy lifestyle measures: - eat a healthy diet consisting of lots of vegetables, fruits, beans, nuts, seeds, healthy meats such as white chicken and fish and whole grains.  - avoid fried foods, fast food, processed foods, sodas, red meet and other fattening foods.  - get a least 150 minutes of aerobic exercise per week.   Follow up in: 2 months      KIM, HANNAH R.

## 2015-04-13 ENCOUNTER — Ambulatory Visit (HOSPITAL_COMMUNITY): Payer: Managed Care, Other (non HMO) | Admitting: Psychiatry

## 2015-04-14 ENCOUNTER — Telehealth (HOSPITAL_COMMUNITY): Payer: Self-pay

## 2015-04-14 NOTE — Telephone Encounter (Signed)
04/14/15 Per Dr. Ladona Ridgelaylor this patient is not appropriate for the TMS program and the patient will not be charge.Marland Kitchen.Marguerite Olea/sh

## 2015-11-23 ENCOUNTER — Emergency Department (HOSPITAL_COMMUNITY)
Admission: EM | Admit: 2015-11-23 | Discharge: 2015-11-23 | Disposition: A | Discharge status: Home / Self Care | Payer: Managed Care, Other (non HMO) | Attending: Emergency Medicine | Admitting: Emergency Medicine

## 2015-11-23 ENCOUNTER — Encounter (HOSPITAL_COMMUNITY): Payer: Self-pay | Admitting: *Deleted

## 2015-11-23 DIAGNOSIS — F17210 Nicotine dependence, cigarettes, uncomplicated: Secondary | ICD-10-CM | POA: Insufficient documentation

## 2015-11-23 DIAGNOSIS — F209 Schizophrenia, unspecified: Secondary | ICD-10-CM | POA: Insufficient documentation

## 2015-11-23 DIAGNOSIS — Z79899 Other long term (current) drug therapy: Secondary | ICD-10-CM | POA: Insufficient documentation

## 2015-11-23 DIAGNOSIS — G4700 Insomnia, unspecified: Secondary | ICD-10-CM

## 2015-11-23 DIAGNOSIS — F329 Major depressive disorder, single episode, unspecified: Secondary | ICD-10-CM | POA: Insufficient documentation

## 2015-11-23 HISTORY — DX: Schizophrenia, unspecified: F20.9

## 2015-11-23 MED ORDER — HYDROXYZINE HCL 25 MG PO TABS
25.0000 mg | ORAL_TABLET | Freq: Once | ORAL | Status: AC
  Administered 2015-11-23: 25 mg via ORAL
  Filled 2015-11-23: qty 1

## 2015-11-23 MED ORDER — HYDROXYZINE HCL 25 MG PO TABS: 25.0000 mg | ORAL_TABLET | Freq: Three times a day (TID) | ORAL | Status: DC | PRN

## 2015-11-23 NOTE — ED Provider Notes (Signed)
CSN: [id number]     Arrival date & time 11/23/15  2047 History  By signing my name below, I, Bridgette HabermannMaria Tan, attest that this documentation has been prepared under the direction and in the presence of Fayrene HelperBowie Tran, PA-C. Electronically Signed: Bridgette HabermannMaria Tan, ED Scribe. 11/23/2015. 9:54 PM.   Chief Complaint  Patient presents with  . Medication Refill    The history is provided by the patient. No language interpreter was used.   HPI Comments: Margaret LericheMary K Farley is a 25 y.o. female with h/o depression, anxiety, insomnia, and schizophrenia who presents to the Emergency Department for a medication refill. Pt is currently on Perphenazine 8 mg and Ambien. Pt states she ran out of medication and she is not able to refill her medication because of the holidays. Pt reports she requested her doctor to double her medication and he told her to double her dose and she has recently run out because she has been taking more than usual and was not prescribed the proper new dosage. She notes she has not been able to sleep for three days. Pt states she has been having problems taking food down because she has been taking Nyquil to sleep and it makes her vomit. Pt states she is not sexually active. LNMP was a week ago.  She states she is very uncomfortable but is not in pain at this time. Pt denies having suicidal and homicidal ideations and hallucinations.   Past Medical History  Diagnosis Date  . Depression   . Anxiety   . ADD (attention deficit disorder)   . Schizophrenia (HCC)    History reviewed. No pertinent past surgical history. Family History  Problem Relation Age of Onset  . Heart disease Father     MI age unknown  . Cancer Maternal Grandmother     breast   Social History  Substance Use Topics  . Smoking status: Current Some Day Smoker -- 0.00 packs/day    Types: Cigarettes  . Smokeless tobacco: None  . Alcohol Use: No   OB History    No data available     Review of Systems  Constitutional: Negative for  fever.  Psychiatric/Behavioral: Negative for suicidal ideas and hallucinations.  All other systems reviewed and are negative.    Allergies  Review of patient's allergies indicates no known allergies.  Home Medications   Prior to Admission medications   Medication Sig Start Date End Date Taking? Authorizing Provider  ALPRAZolam Prudy Feeler(XANAX) 0.5 MG tablet Take 0.5 mg by mouth 2 (two) times daily as needed for anxiety.  11/12/15  Yes Historical Provider, MD  perphenazine (TRILAFON) 8 MG tablet Take 8 mg by mouth every evening. 11/10/15  Yes Historical Provider, MD  Pseudoeph-Doxylamine-DM-APAP (NYQUIL PO) Take 1 capsule by mouth at bedtime.   Yes Historical Provider, MD  traZODone (DESYREL) 100 MG tablet Take 2 tablets by mouth at bedtime. 11/12/15  Yes Historical Provider, MD  zolpidem (AMBIEN) 5 MG tablet Take 5 mg by mouth at bedtime. 11/12/15  Yes Historical Provider, MD   BP 111/69 mmHg  Pulse 103  Temp(Src) 98.1 F (36.7 C) (Oral)  Resp 22  Ht 5\' 2"  (1.575 m)  Wt 110 lb (49.896 kg)  BMI 20.11 kg/m2  SpO2 98% Physical Exam  Constitutional: She is oriented to person, place, and time. She appears well-developed and well-nourished.  HENT:  Head: Normocephalic.  Eyes: Conjunctivae are normal.  Cardiovascular: Normal rate, regular rhythm and normal heart sounds.   Pulmonary/Chest: Effort normal and breath  sounds normal. No respiratory distress.  Abdominal: She exhibits no distension.  Musculoskeletal: Normal range of motion.  Neurological: She is alert and oriented to person, place, and time.  Skin: Skin is warm and dry.  Psychiatric: Her affect is blunt. She is slowed. Thought content is not paranoid. She expresses no homicidal and no suicidal ideation.  Blunt affect but calm and cooperative.   Nursing note and vitals reviewed.   ED Course  Procedures  DIAGNOSTIC STUDIES: Oxygen Saturation is 98% on RA, normal by my interpretation.    COORDINATION OF CARE: 9:49 PM Discussed  treatment plan with pt at bedside which includes Rx of Atarax and pt agreed to plan.   MDM   Final diagnoses:  Insomnia    BP 111/69 mmHg  Pulse 103  Temp(Src) 98.1 F (36.7 C) (Oral)  Resp 22  Ht 5\' 2"  (1.575 m)  Wt 49.896 kg  BMI 20.11 kg/m2  SpO2 98%   I personally performed the services described in this documentation, which was scribed in my presence. The recorded information has been reviewed and is accurate.   Patient report that she ran out of her medications including perphenazine and Ambien and therefore she is having trouble sleeping for the past 2 days. She attributed finish her medication prematurely due to increasing in dose, which her psychiatrist is aware but forgot to change her medication.  No red flags noted. I do not feel comfortable prescribing her psychiatric medications.  I will give patient a short course of Atarax to help with her anxiety and sleep. She can follow-up with her doctor for further care.   Fayrene HelperBowie Tran, PA-C 11/23/15 2223  Doug SouSam Jacubowitz, MD 11/23/15 [phone]

## 2015-11-23 NOTE — Discharge Instructions (Signed)
Please follow up with your psychiatrist for further management of your insomnia.  Take Vistaril as needed for insomnia  Insomnia Insomnia is a sleep disorder that makes it difficult to fall asleep or to stay asleep. Insomnia can cause tiredness (fatigue), low energy, difficulty concentrating, mood swings, and poor performance at work or school.  There are three different ways to classify insomnia:  Difficulty falling asleep.  Difficulty staying asleep.  Waking up too early in the morning. Any type of insomnia can be long-term (chronic) or short-term (acute). Both are common. Short-term insomnia usually lasts for three months or less. Chronic insomnia occurs at least three times a week for longer than three months. CAUSES  Insomnia may be caused by another condition, situation, or substance, such as:  Anxiety.  Certain medicines.  Gastroesophageal reflux disease (GERD) or other gastrointestinal conditions.  Asthma or other breathing conditions.  Restless legs syndrome, sleep apnea, or other sleep disorders.  Chronic pain.  Menopause. This may include hot flashes.  Stroke.  Abuse of alcohol, tobacco, or illegal drugs.  Depression.  Caffeine.   Neurological disorders, such as Alzheimer disease.  An overactive thyroid (hyperthyroidism). The cause of insomnia may not be known. RISK FACTORS Risk factors for insomnia include:  Gender. Women are more commonly affected than men.  Age. Insomnia is more common as you get older.  Stress. This may involve your professional or personal life.  Income. Insomnia is more common in people with lower income.  Lack of exercise.   Irregular work schedule or night shifts.  Traveling between different time zones. SIGNS AND SYMPTOMS If you have insomnia, trouble falling asleep or trouble staying asleep is the main symptom. This may lead to other symptoms, such as:  Feeling fatigued.  Feeling nervous about going to  sleep.  Not feeling rested in the morning.  Having trouble concentrating.  Feeling irritable, anxious, or depressed. TREATMENT  Treatment for insomnia depends on the cause. If your insomnia is caused by an underlying condition, treatment will focus on addressing the condition. Treatment may also include:   Medicines to help you sleep.  Counseling or therapy.  Lifestyle adjustments. HOME CARE INSTRUCTIONS   Take medicines only as directed by your health care provider.  Keep regular sleeping and waking hours. Avoid naps.  Keep a sleep diary to help you and your health care provider figure out what could be causing your insomnia. Include:   When you sleep.  When you wake up during the night.  How well you sleep.   How rested you feel the next day.  Any side effects of medicines you are taking.  What you eat and drink.   Make your bedroom a comfortable place where it is easy to fall asleep:  Put up shades or special blackout curtains to block light from outside.  Use a white noise machine to block noise.  Keep the temperature cool.   Exercise regularly as directed by your health care provider. Avoid exercising right before bedtime.  Use relaxation techniques to manage stress. Ask your health care provider to suggest some techniques that may work well for you. These may include:  Breathing exercises.  Routines to release muscle tension.  Visualizing peaceful scenes.  Cut back on alcohol, caffeinated beverages, and cigarettes, especially close to bedtime. These can disrupt your sleep.  Do not overeat or eat spicy foods right before bedtime. This can lead to digestive discomfort that can make it hard for you to sleep.  Limit screen use  before bedtime. This includes:  Watching TV.  Using your smartphone, tablet, and computer.  Stick to a routine. This can help you fall asleep faster. Try to do a quiet activity, brush your teeth, and go to bed at the same  time each night.  Get out of bed if you are still awake after 15 minutes of trying to sleep. Keep the lights down, but try reading or doing a quiet activity. When you feel sleepy, go back to bed.  Make sure that you drive carefully. Avoid driving if you feel very sleepy.  Keep all follow-up appointments as directed by your health care provider. This is important. SEEK MEDICAL CARE IF:   You are tired throughout the day or have trouble in your daily routine due to sleepiness.  You continue to have sleep problems or your sleep problems get worse. SEEK IMMEDIATE MEDICAL CARE IF:   You have serious thoughts about hurting yourself or someone else.   This information is not intended to replace advice given to you by your health care provider. Make sure you discuss any questions you have with your health care provider.   Document Released: 05/05/2000 Document Revised: 01/27/2015 Document Reviewed: 02/06/2014 Elsevier Interactive Patient Education [employer]2016 Elsevier Inc.

## 2015-11-23 NOTE — ED Notes (Signed)
PT states that she has a hx of schizophrenia and insomnia; pt states that she is out of Perphenazine 8mg  and Ambien and has been unable to sleep x 2 days; pt states that she "feels bad" because she has not been able to sleep; pt denies SI / HI; pt states "I just need my meds so I can sleep and I couldn't get them refilled because of the holiday

## 2016-11-07 ENCOUNTER — Encounter (HOSPITAL_COMMUNITY): Payer: Self-pay | Admitting: Emergency Medicine

## 2016-11-07 ENCOUNTER — Emergency Department (HOSPITAL_COMMUNITY)
Admission: EM | Admit: 2016-11-07 | Discharge: 2016-11-08 | Disposition: A | Discharge status: Other Acute Inpatient Hospital | Payer: Medicaid Other | Attending: Emergency Medicine | Admitting: Emergency Medicine

## 2016-11-07 DIAGNOSIS — Z79899 Other long term (current) drug therapy: Secondary | ICD-10-CM | POA: Insufficient documentation

## 2016-11-07 DIAGNOSIS — F918 Other conduct disorders: Secondary | ICD-10-CM | POA: Insufficient documentation

## 2016-11-07 DIAGNOSIS — F251 Schizoaffective disorder, depressive type: Secondary | ICD-10-CM | POA: Diagnosis not present

## 2016-11-07 DIAGNOSIS — F209 Schizophrenia, unspecified: Secondary | ICD-10-CM | POA: Diagnosis not present

## 2016-11-07 DIAGNOSIS — F17210 Nicotine dependence, cigarettes, uncomplicated: Secondary | ICD-10-CM | POA: Insufficient documentation

## 2016-11-07 DIAGNOSIS — F902 Attention-deficit hyperactivity disorder, combined type: Secondary | ICD-10-CM | POA: Diagnosis not present

## 2016-11-07 DIAGNOSIS — F203 Undifferentiated schizophrenia: Secondary | ICD-10-CM

## 2016-11-07 LAB — COMPREHENSIVE METABOLIC PANEL
ALT: 28 U/L (ref 14–54)
AST: 29 U/L (ref 15–41)
Albumin: 4.8 g/dL (ref 3.5–5.0)
Alkaline Phosphatase: 69 U/L (ref 38–126)
Anion gap: 9 (ref 5–15)
BUN: 17 mg/dL (ref 6–20)
CO2: 24 mmol/L (ref 22–32)
Calcium: 9.2 mg/dL (ref 8.9–10.3)
Chloride: 104 mmol/L (ref 101–111)
Creatinine, Ser: 0.76 mg/dL (ref 0.44–1.00)
GFR calc Af Amer: >60 mL/min (ref >60)
GFR calc non Af Amer: >60 mL/min (ref >60)
Glucose, Bld: 114 mg/dL — ABNORMAL HIGH (ref 65–99)
Potassium: 4.1 mmol/L (ref 3.5–5.1)
Sodium: 137 mmol/L (ref 135–145)
Total Bilirubin: 0.8 mg/dL (ref 0.3–1.2)
Total Protein: 8.6 g/dL — ABNORMAL HIGH (ref 6.5–8.1)

## 2016-11-07 LAB — I-STAT BETA HCG BLOOD, ED (MC, WL, AP ONLY): I-stat hCG, quantitative: <5 m[IU]/mL (ref <5)

## 2016-11-07 LAB — RAPID URINE DRUG SCREEN, HOSP PERFORMED
Amphetamines: POSITIVE — AB
Barbiturates: NOT DETECTED
Benzodiazepines: POSITIVE — AB
Cocaine: NOT DETECTED
Opiates: NOT DETECTED
Tetrahydrocannabinol: NOT DETECTED

## 2016-11-07 LAB — SALICYLATE LEVEL: Salicylate Lvl: <7 mg/dL (ref 2.8–30.0)

## 2016-11-07 LAB — CBC
HCT: 44.4 % (ref 36.0–46.0)
Hemoglobin: 14.8 g/dL (ref 12.0–15.0)
MCH: 31.4 pg (ref 26.0–34.0)
MCHC: 33.3 g/dL (ref 30.0–36.0)
MCV: 94.1 fL (ref 78.0–100.0)
Platelets: 247 10*3/uL (ref 150–400)
RBC: 4.72 MIL/uL (ref 3.87–5.11)
RDW: 12.1 % (ref 11.5–15.5)
WBC: 7.4 10*3/uL (ref 4.0–10.5)

## 2016-11-07 LAB — ACETAMINOPHEN LEVEL: Acetaminophen (Tylenol), Serum: <10 ug/mL — ABNORMAL LOW (ref 10–30)

## 2016-11-07 LAB — ETHANOL: Alcohol, Ethyl (B): <5 mg/dL (ref <5)

## 2016-11-07 MED ORDER — HYDROXYZINE HCL 25 MG PO TABS
25.0000 mg | ORAL_TABLET | Freq: Three times a day (TID) | ORAL | Status: DC | PRN
  Administered 2016-11-07 – 2016-11-08 (×4): 25 mg via ORAL
  Filled 2016-11-07 (×4): qty 1

## 2016-11-07 MED ORDER — ZOLPIDEM TARTRATE 5 MG PO TABS
5.0000 mg | ORAL_TABLET | Freq: Every day | ORAL | Status: DC
  Administered 2016-11-07: 5 mg via ORAL
  Filled 2016-11-07: qty 1

## 2016-11-07 MED ORDER — ALPRAZOLAM 0.5 MG PO TABS
0.5000 mg | ORAL_TABLET | Freq: Two times a day (BID) | ORAL | Status: DC | PRN
  Administered 2016-11-07 – 2016-11-08 (×2): 0.5 mg via ORAL
  Filled 2016-11-07 (×2): qty 1

## 2016-11-07 MED ORDER — TRAZODONE HCL 100 MG PO TABS
200.0000 mg | ORAL_TABLET | Freq: Every day | ORAL | Status: DC
  Administered 2016-11-07: 200 mg via ORAL
  Filled 2016-11-07: qty 2

## 2016-11-07 MED ORDER — IBUPROFEN 200 MG PO TABS
600.0000 mg | ORAL_TABLET | Freq: Four times a day (QID) | ORAL | Status: DC | PRN
  Administered 2016-11-07 (×2): 600 mg via ORAL
  Filled 2016-11-07 (×2): qty 3

## 2016-11-07 MED ORDER — PERPHENAZINE 4 MG PO TABS
8.0000 mg | ORAL_TABLET | Freq: Every evening | ORAL | Status: DC
Start: 2016-11-07 — End: 2016-11-08
  Administered 2016-11-07: 8 mg via ORAL
  Filled 2016-11-07: qty 2

## 2016-11-07 NOTE — Progress Notes (Signed)
11/07/16 1359:  LRT went to pt room to introduce self, pt was doing a telepsych.  Caroll RancherMarjette [name], LRT/CTRS
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
## 2016-11-07 NOTE — ED Notes (Signed)
Introduced self to patient/family. Pt oriented to unit expectations.  Assessed pt for:  A) Anxiety &/or agitation: On admission to the [facility] pt is calm and cooperative, but she is afraid that she will have a panic attack, and that we will not be able to help her. She takes Xanax 4 mg at night to sleep, and prn when anxious.  S) Safety: Safety maintained with q-15-minute checks and hourly rounds by staff.  A) ADLs: Pt able to perform ADLs independently.  P) Pick-Up (room cleanliness): Pt's room clean and free of clutter.

## 2016-11-07 NOTE — ED Notes (Signed)
Bed: [facility] Expected date:  Expected time:  Means of arrival:  Comments: 

## 2016-11-07 NOTE — ED Notes (Signed)
Pt is room watching TV on approach. Pt reports she feels okay and asked when she can be discharged. Pt reports feeling less anxious. Pt denies SI/HI/AVH.

## 2016-11-08 DIAGNOSIS — Z79899 Other long term (current) drug therapy: Secondary | ICD-10-CM | POA: Diagnosis not present

## 2016-11-08 DIAGNOSIS — F17210 Nicotine dependence, cigarettes, uncomplicated: Secondary | ICD-10-CM

## 2016-11-08 DIAGNOSIS — F251 Schizoaffective disorder, depressive type: Secondary | ICD-10-CM

## 2016-11-08 MED ORDER — CLONAZEPAM 0.5 MG PO TABS
0.5000 mg | ORAL_TABLET | Freq: Every day | ORAL | Status: DC
  Administered 2016-11-08: 0.5 mg via ORAL
  Filled 2016-11-08: qty 1

## 2016-11-08 MED ORDER — PERPHENAZINE 4 MG PO TABS
8.0000 mg | ORAL_TABLET | Freq: Two times a day (BID) | ORAL | Status: DC
  Filled 2016-11-08: qty 2

## 2016-11-08 MED ORDER — TRAZODONE HCL 100 MG PO TABS: 200.0000 mg | ORAL_TABLET | Freq: Every evening | ORAL | Status: DC | PRN

## 2016-11-08 MED ORDER — SERTRALINE HCL 50 MG PO TABS: 200.0000 mg | ORAL_TABLET | Freq: Every day | ORAL | Status: DC
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
## 2016-11-08 NOTE — ED Notes (Signed)
Called and left message for Sgt Pascall [phone] to return call and requested transportation.

## 2016-11-08 NOTE — BH Assessment (Signed)
[facility]. The accepting provider is Dr. Zoe Laneloris Brown. Nursing report [phone].

## 2016-11-08 NOTE — Progress Notes (Signed)
11/08/16 1354:  LRT went to pt room to offer activities, pt was sleep.  Marjette Linday, LRT/CTRS

## 2016-11-08 NOTE — BH Assessment (Signed)
[facility] Assessment Progress Note  Per Thedore MinsMojeed Akintayo, MD, this pt requires psychiatric hospitalization at this time.  Pt presents under IVC initiated by her mother, and upheld by Dr Jannifer FranklinAkintayo.  The following facilities have been contacted to seek placement for this pt, with results as noted:  Beds available, information sent, decision pending[facility]   [facility]  [facility]   Thomas [place], [place] Triage Specialist [phone]

## 2016-11-08 NOTE — ED Notes (Signed)
Notes brought in by pt's mother with pt medical/psyc information. Information taken to MD and then placed in chart.

## 2016-11-08 NOTE — ED Notes (Signed)
[facility] All items returned to transportation.

## 2016-11-08 NOTE — ED Notes (Signed)
Pt is alert in her room with the TV on. Pt c/o anxiety and was given prn medication. She denies si and hi. Pt is waiting to talk with the MD.

## 2017-07-22 ENCOUNTER — Emergency Department (HOSPITAL_COMMUNITY)
Admission: EM | Admit: 2017-07-22 | Discharge: 2017-07-23 | Disposition: A | Discharge status: Other Acute Inpatient Hospital | Payer: Medicaid Other | Attending: Emergency Medicine | Admitting: Emergency Medicine

## 2017-07-22 ENCOUNTER — Encounter (HOSPITAL_COMMUNITY): Payer: Self-pay | Admitting: *Deleted

## 2017-07-22 DIAGNOSIS — R443 Hallucinations, unspecified: Secondary | ICD-10-CM

## 2017-07-22 DIAGNOSIS — Z046 Encounter for general psychiatric examination, requested by authority: Secondary | ICD-10-CM | POA: Insufficient documentation

## 2017-07-22 DIAGNOSIS — Z008 Encounter for other general examination: Secondary | ICD-10-CM

## 2017-07-22 DIAGNOSIS — F259 Schizoaffective disorder, unspecified: Secondary | ICD-10-CM | POA: Diagnosis present

## 2017-07-22 DIAGNOSIS — F17210 Nicotine dependence, cigarettes, uncomplicated: Secondary | ICD-10-CM | POA: Insufficient documentation

## 2017-07-22 DIAGNOSIS — Z79899 Other long term (current) drug therapy: Secondary | ICD-10-CM | POA: Insufficient documentation

## 2017-07-22 DIAGNOSIS — F209 Schizophrenia, unspecified: Secondary | ICD-10-CM | POA: Insufficient documentation

## 2017-07-22 DIAGNOSIS — F258 Other schizoaffective disorders: Secondary | ICD-10-CM

## 2017-07-22 DIAGNOSIS — Z818 Family history of other mental and behavioral disorders: Secondary | ICD-10-CM | POA: Diagnosis not present

## 2017-07-22 DIAGNOSIS — R440 Auditory hallucinations: Secondary | ICD-10-CM | POA: Diagnosis present

## 2017-07-22 LAB — CBC WITH DIFFERENTIAL/PLATELET
Basophils Absolute: 0 10*3/uL (ref 0.0–0.1)
Basophils Relative: 0 %
Eosinophils Absolute: 0.1 10*3/uL (ref 0.0–0.7)
Eosinophils Relative: 1 %
HCT: 42.4 % (ref 36.0–46.0)
Hemoglobin: 13.9 g/dL (ref 12.0–15.0)
Lymphocytes Relative: 52 %
Lymphs Abs: 3.4 10*3/uL (ref 0.7–4.0)
MCH: 32 pg (ref 26.0–34.0)
MCHC: 32.8 g/dL (ref 30.0–36.0)
MCV: 97.5 fL (ref 78.0–100.0)
Monocytes Absolute: 0.4 10*3/uL (ref 0.1–1.0)
Monocytes Relative: 5 %
Neutro Abs: 2.7 10*3/uL (ref 1.7–7.7)
Neutrophils Relative %: 42 %
Platelets: 269 10*3/uL (ref 150–400)
RBC: 4.35 MIL/uL (ref 3.87–5.11)
RDW: 12.4 % (ref 11.5–15.5)
WBC: 6.5 10*3/uL (ref 4.0–10.5)

## 2017-07-22 LAB — COMPREHENSIVE METABOLIC PANEL
ALT: 14 U/L (ref 14–54)
AST: 22 U/L (ref 15–41)
Albumin: 4.4 g/dL (ref 3.5–5.0)
Alkaline Phosphatase: 55 U/L (ref 38–126)
Anion gap: 10 (ref 5–15)
BUN: 20 mg/dL (ref 6–20)
CO2: 26 mmol/L (ref 22–32)
Calcium: 9.4 mg/dL (ref 8.9–10.3)
Chloride: 103 mmol/L (ref 101–111)
Creatinine, Ser: 0.63 mg/dL (ref 0.44–1.00)
GFR calc Af Amer: >60 mL/min (ref >60)
GFR calc non Af Amer: >60 mL/min (ref >60)
Glucose, Bld: 104 mg/dL — ABNORMAL HIGH (ref 65–99)
Potassium: 4.1 mmol/L (ref 3.5–5.1)
Sodium: 139 mmol/L (ref 135–145)
Total Bilirubin: 0.7 mg/dL (ref 0.3–1.2)
Total Protein: 7.7 g/dL (ref 6.5–8.1)

## 2017-07-22 LAB — RAPID URINE DRUG SCREEN, HOSP PERFORMED
Amphetamines: NOT DETECTED
Barbiturates: NOT DETECTED
Benzodiazepines: POSITIVE — AB
Cocaine: NOT DETECTED
Opiates: NOT DETECTED
Tetrahydrocannabinol: NOT DETECTED

## 2017-07-22 LAB — PREGNANCY, URINE: Preg Test, Ur: NEGATIVE

## 2017-07-22 LAB — ETHANOL: Alcohol, Ethyl (B): <10 mg/dL (ref <10)

## 2017-07-22 LAB — SALICYLATE LEVEL: Salicylate Lvl: <7 mg/dL (ref 2.8–30.0)

## 2017-07-22 LAB — ACETAMINOPHEN LEVEL: Acetaminophen (Tylenol), Serum: <10 ug/mL — ABNORMAL LOW (ref 10–30)

## 2017-07-22 MED ORDER — ESCITALOPRAM OXALATE 10 MG PO TABS
10.0000 mg | ORAL_TABLET | Freq: Every day | ORAL | Status: DC
  Administered 2017-07-23: 10 mg via ORAL
  Filled 2017-07-22: qty 1

## 2017-07-22 MED ORDER — ALUM & MAG HYDROXIDE-SIMETH 200-200-20 MG/5ML PO SUSP: 30.0000 mL | Freq: Four times a day (QID) | ORAL | Status: DC | PRN

## 2017-07-22 MED ORDER — ONDANSETRON HCL 4 MG PO TABS: 4.0000 mg | ORAL_TABLET | Freq: Three times a day (TID) | ORAL | Status: DC | PRN

## 2017-07-22 MED ORDER — ALPRAZOLAM 1 MG PO TABS
1.0000 mg | ORAL_TABLET | Freq: Every day | ORAL | Status: DC
  Administered 2017-07-22: 1 mg via ORAL
  Filled 2017-07-22: qty 1

## 2017-07-22 MED ORDER — TEMAZEPAM 15 MG PO CAPS
15.0000 mg | ORAL_CAPSULE | Freq: Every day | ORAL | Status: DC
Start: 2017-07-22 — End: 2017-07-23
  Administered 2017-07-22: 15 mg via ORAL
  Filled 2017-07-22: qty 1

## 2017-07-22 MED ORDER — TRETINOIN 0.025 % EX CREA: 1.0000 "application " | TOPICAL_CREAM | Freq: Every day | CUTANEOUS | Status: DC

## 2017-07-22 MED ORDER — ACETAMINOPHEN 325 MG PO TABS
650.0000 mg | ORAL_TABLET | ORAL | Status: DC | PRN
  Administered 2017-07-23: 650 mg via ORAL
  Filled 2017-07-22: qty 2

## 2017-07-22 MED ORDER — TRAZODONE HCL 100 MG PO TABS
300.0000 mg | ORAL_TABLET | Freq: Every day | ORAL | Status: DC
  Administered 2017-07-22: 300 mg via ORAL
  Filled 2017-07-22: qty 3

## 2017-07-22 MED ORDER — SERTRALINE HCL 50 MG PO TABS
200.0000 mg | ORAL_TABLET | Freq: Every day | ORAL | Status: DC
  Administered 2017-07-23: 200 mg via ORAL
  Filled 2017-07-22: qty 4

## 2017-07-22 MED ORDER — ALPRAZOLAM 0.5 MG PO TABS
0.5000 mg | ORAL_TABLET | Freq: Three times a day (TID) | ORAL | Status: DC
  Administered 2017-07-23: 0.5 mg via ORAL
  Filled 2017-07-22: qty 1

## 2017-07-22 MED ORDER — ZOLPIDEM TARTRATE 10 MG PO TABS
10.0000 mg | ORAL_TABLET | Freq: Every day | ORAL | Status: DC
  Administered 2017-07-22: 10 mg via ORAL
  Filled 2017-07-22: qty 1

## 2017-07-22 NOTE — Progress Notes (Signed)
Per Nira ConnJason Berry, NP pt is recommended for inpt admission. TTS to seek placement. EDP Street, [place], [employer] and pt's nurse have been advised of the disposition.  Princess BruinsAquicha Duff, MSW, LCSW Therapeutic Triage Specialist  [phone]

## 2017-07-22 NOTE — ED Triage Notes (Addendum)
Pt brought in with IVC papers. Paperwork states the pt is hearing voices telling her to commit suicide and is not taking care of her personal hygiene. Pt has hx of schizophrenia.   Pt denies the voices tell her to hurt herself, states the voices say negative things about things she has done and "body shaming". Pt denies thoughts of hurting herself or others.

## 2017-07-22 NOTE — ED Notes (Signed)
SBAR Report received from previous nurse. Pt received calm and visible on unit. Pt denies current SI/ HI, A/V H, depression, anxiety, or pain at this time, and appears otherwise stable and free of distress. IVC papers indicate pt not compliant with medication, and hallucinating. Pt reminded of camera surveillance, q 15 min rounds, and rules of the milieu. Will continue to assess.

## 2017-07-22 NOTE — [facility] Counselor (Signed)
Attempted to reach IVC Petitioner -- Forest GleasonSandra Patton Steinhilber, Pt's mother.  Phone rang to voicemail, and mailbox was full.

## 2017-07-22 NOTE — ED Notes (Signed)
Bed: [facility] Expected date:  Expected time:  Means of arrival:  Comments: 

## 2017-07-22 NOTE — BH Assessment (Addendum)
Assessment Note  Margaret Farley is an 27 y.o. female who presents to the ED under IVC initiated by her mother. According to the IVC, the pt "is not taking care of her personal hygiene, hearing voices that are telling her to commit suicide. Voices are saying just do it tonight, what are you waiting for. Respondent believes she has speakers in ears. Voices are of every person she knows and they all say what are you waiting for. We all want you to die because you deserve it."  TTS counselor spoke with the pt's mother while she was in triage with the pt and mom reported the pt has been hearing voices that are getting more aggressive. The pt states she has not been able to take any medication that helps with the voices and she is beginning to feel helpless. Pt states she has not been able to sleep due to her anxiety and AH. Pt has a hx of inpt admissions c/o Schizophrenia.   Per Nira Conn, NP pt is recommended for inpt admission. TTS to seek placement. EDP Street, [place], [employer] and pt's nurse have been advised of the disposition.  Diagnosis: Schizophrenia   Past Medical History:  Past Medical History:  Diagnosis Date  . ADD (attention deficit disorder)   . Anxiety   . Depression   . Schizophrenia ([facility])     History reviewed. No pertinent surgical history.  Family History:  Family History  Problem Relation Age of Onset  . Heart disease Father        MI age unknown  . Cancer Maternal Grandmother        breast    Social History:  reports that she has been smoking cigarettes.  She has been smoking about 0.00 packs per day. she has never used smokeless tobacco. She reports that she does not drink alcohol or use drugs.  Additional Social History:  Alcohol / Drug Use Pain Medications: see MAR Prescriptions: see MAR Over the Counter: see MAR History of alcohol / drug use?: No history of alcohol / drug abuse  CIWA: CIWA-Ar BP: (!) 135/99 Pulse Rate: 77 COWS:    Allergies:  Allergies   Allergen Reactions  . Benadryl Allergy  [Diphenhydramine] Other (See Comments)    Restless leg syndrome    Home Medications:  (Not in a hospital admission)  OB/GYN Status:  Patient's last menstrual period was 07/08/2017.  General Assessment Data Location of Assessment: [facility] TTS Assessment: In system Is this a Tele or Face-to-Face Assessment?: Face-to-Face Is this an Initial Assessment or a Re-assessment for this encounter?: Initial Assessment Marital status: Single Is patient pregnant?: No Pregnancy Status: No Living Arrangements: Parent Can pt return to current living arrangement?: Yes Admission Status: Involuntary Is patient capable of signing voluntary admission?: No Referral Source: Self/Family/Friend Insurance type: Medicaid     Crisis Care Plan Living Arrangements: Parent Name of Psychiatrist: [facility] Name of Therapist: Transport planner  Education Status Is patient currently in school?: No Highest grade of school patient has completed: 2 years of college  Risk to self with the past 6 months Suicidal Ideation: No Has patient been a risk to self within the past 6 months prior to admission? : No Suicidal Intent: No Has patient had any suicidal intent within the past 6 months prior to admission? : No Is patient at risk for suicide?: No Suicidal Plan?: No Has patient had any suicidal plan within the past 6 months prior to admission? : No Access to Means: No  What has been your use of drugs/alcohol within the last 12 months?: denies use  Previous Attempts/Gestures: No Triggers for Past Attempts: None known Intentional Self Injurious Behavior: None Family Suicide History: No Recent stressful life event(s): Other (Comment)(worsening AH) Persecutory voices/beliefs?: No Depression: No Substance abuse history and/or treatment for substance abuse?: No Suicide prevention information given to non-admitted patients: Not applicable  Risk to Others within the past 6  months Homicidal Ideation: No Does patient have any lifetime risk of violence toward others beyond the six months prior to admission? : No Thoughts of Harm to Others: No Current Homicidal Intent: No Current Homicidal Plan: No Access to Homicidal Means: No History of harm to others?: No Assessment of Violence: None Noted Does patient have access to weapons?: No Criminal Charges Pending?: No Does patient have a court date: No Is patient on probation?: No  Psychosis Hallucinations: Auditory, With command Delusions: None noted  Mental Status Report Appearance/Hygiene: Unremarkable Eye Contact: Good Motor Activity: Freedom of movement Speech: Logical/coherent Level of Consciousness: Alert Mood: Anxious Affect: Anxious Anxiety Level: Severe Thought Processes: Relevant, Coherent Judgement: Impaired Orientation: Person, Place, Situation, Time, Appropriate for developmental age Obsessive Compulsive Thoughts/Behaviors: None  Cognitive Functioning Concentration: Normal Memory: Remote Intact, Recent Intact IQ: Average Insight: Poor Impulse Control: Poor Appetite: Good Sleep: Decreased Total Hours of Sleep: 4 Vegetative Symptoms: None  ADLScreening [facility] Assessment Services) Patient's cognitive ability adequate to safely complete daily activities?: Yes Patient able to express need for assistance with ADLs?: Yes Independently performs ADLs?: Yes (appropriate for developmental age)  Prior Inpatient Therapy Prior Inpatient Therapy: Yes Prior Therapy Dates: 2018, 2017 Prior Therapy Facilty/Provider(s[facility] Reason for Treatment: SCHIZOPHRENIA   Prior Outpatient Therapy Prior Outpatient Therapy: Yes Prior Therapy Dates: current Prior Therapy Facilty/Provider(s): [facility] Reason for Treatment: Schizophrenia  Does patient have an ACCT team?: No Does patient have Intensive In-House Services?  : No Does patient have [facility] services? : Yes Does patient have  P4CC services?: No  ADL Screening (condition at time of admission) Patient's cognitive ability adequate to safely complete daily activities?: Yes Is the patient deaf or have difficulty hearing?: No Does the patient have difficulty seeing, even when wearing glasses/contacts?: No Does the patient have difficulty concentrating, remembering, or making decisions?: No Patient able to express need for assistance with ADLs?: Yes Does the patient have difficulty dressing or bathing?: No Independently performs ADLs?: Yes (appropriate for developmental age) Does the patient have difficulty walking or climbing stairs?: No Weakness of Legs: None Weakness of Arms/Hands: None  Home Assistive Devices/Equipment Home Assistive Devices/Equipment: None    Abuse/Neglect Assessment (Assessment to be complete while patient is alone) Abuse/Neglect Assessment Can Be Completed: Yes Physical Abuse: Denies Verbal Abuse: Denies Sexual Abuse: Denies Exploitation of patient/patient's resources: Denies Self-Neglect: Denies     Merchant navy officerAdvance Directives (For Healthcare) Does Patient Have a Medical Advance Directive?: No Would patient like information on creating a medical advance directive?: No - Patient declined    Additional Information 1:1 In Past 12 Months?: No CIRT Risk: No Elopement Risk: No Does patient have medical clearance?: Yes     Disposition: Per Nira ConnJason Berry, NP pt is recommended for inpt admission. TTS to seek placement. EDP Street, [place], [employer] and pt's nurse have been advised of the disposition.  Disposition Initial Assessment Completed for this Encounter: Yes Disposition of Patient: Inpatient treatment program Type of inpatient treatment program: Adult(per Nira ConnJason Berry, NP)  On Site Evaluation by:   Reviewed with Physician:  Karolee Ohs 07/22/2017 8:35 PM

## 2017-07-22 NOTE — ED Provider Notes (Signed)
[facility]-EMERGENCY DEPT Provider Note   CSN: [id number] Arrival date & time: 07/22/17  1552     History   Chief Complaint Chief Complaint  Patient presents with  . IVC    HPI Margaret Farley is a 27 y.o. female with a PMHx of ADD, anxiety, depression, and schizophrenia, who presents to the ED via [facility] under IVC.  Per IVC paperwork: "Respondent has been diagnosed with schizophrenia.  Respondent is prescribed Xanax, zolpidem, and trazodone.  Respondent is not taking care of her personal hygiene.  Respondent is hearing voices that are telling her to commit suicide.  Respondent is stating the voices are saying "just do it tonight" "what are you waiting for".  Respondent believes she has speakers in ears.  Respondent states the voices are of every person she knows and they will all say "what are you waiting for.  We all want you to die because you deserve it"."  Patient states that she does have auditory hallucinations sometimes, hearing voices insulting her, but they do not command her to do anything.  She denies SI, HI, visual hallucinations, illicit drug use, alcohol use, or tobacco use.  She sees Dr. Lonia Skinner at [facility] for psychiatric care.  She is prescribed multiple medications, she takes them as directed but hasn't had today's doses because she just woke up.  The medications include: an unknown schizophrenia medication, xanax 0.5mg  TID PRN + 1mg  QHS, Lexapro unknown dose every morning, Zoloft 200mg  qAM, Ambien 10mg  QHS, Trazodone 100mg  QHS, and Temazepam unknown dose at bedtime.  She has no medical complaints at this time.  LMP 2wks ago.     The history is provided by the patient, medical records and the police. No language interpreter was used.  Mental Health Problem  Presenting symptoms: hallucinations   Presenting symptoms: no homicidal ideas and no suicidal thoughts   Patient accompanied by:  Law enforcement Onset quality:  Unable to specify Timing:   Unable to specify Progression:  Unable to specify Chronicity:  Chronic Treatment compliance:  All of the time Time since last psychoactive medication taken:  1 day Relieved by:  None tried Worsened by:  Nothing Ineffective treatments:  None tried Associated symptoms: no abdominal pain and no chest pain   Risk factors: hx of mental illness   Risk factors: no recent psychiatric admission     Past Medical History:  Diagnosis Date  . ADD (attention deficit disorder)   . Anxiety   . Depression   . Schizophrenia [facility])     Patient Active Problem List   Diagnosis Date Noted  . Schizoaffective disorder, depressive type (HCC) 11/08/2016    History reviewed. No pertinent surgical history.  OB History    No data available       Home Medications    Prior to Admission medications   Medication Sig Start Date End Date Taking? Authorizing Provider  ALPRAZolam Prudy Feeler) 1 MG tablet Take 1 mg by mouth 4 (four) times daily as needed for anxiety.    [provider]  amphetamine-dextroamphetamine (ADDERALL XR) 20 MG 24 hr capsule Take 20 mg by mouth daily.    [provider]  Asenapine Maleate (SAPHRIS) 10 MG SUBL Place 10 mg under the tongue at bedtime.    [provider]  Pseudoeph-Doxylamine-DM-APAP (NYQUIL PO) Take 12 capsules by mouth at bedtime.     [provider]  sertraline (ZOLOFT) 100 MG tablet Take 200 mg by mouth at bedtime.  [provider]  traZODone (DESYREL) 100 MG tablet Take 300 mg by mouth at bedtime.     [provider]  tretinoin (RETIN-A) 0.025 % cream Apply 1 application topically at bedtime.    [provider]  zolpidem (AMBIEN) 10 MG tablet Take 15 mg by mouth at bedtime.    [provider]    Family History Family History  Problem Relation Age of Onset  . Heart disease Father        MI age unknown  . Cancer Maternal Grandmother        breast    Social History Social History    Tobacco Use  . Smoking status: Current Some Day Smoker    Packs/day: 0.00    Types: Cigarettes  . Smokeless tobacco: Never Used  Substance Use Topics  . Alcohol use: No  . Drug use: No     Allergies   Patient has no known allergies.   Review of Systems Review of Systems  Constitutional: Negative for chills and fever.  Respiratory: Negative for shortness of breath.   Cardiovascular: Negative for chest pain.  Gastrointestinal: Negative for abdominal pain, constipation, diarrhea, nausea and vomiting.  Genitourinary: Negative for dysuria and hematuria.  Musculoskeletal: Negative for arthralgias and myalgias.  Skin: Negative for color change.  Allergic/Immunologic: Negative for immunocompromised state.  Neurological: Negative for weakness and numbness.  Psychiatric/Behavioral: Positive for hallucinations. Negative for confusion, homicidal ideas and suicidal ideas.   All other systems reviewed and are negative for acute change except as noted in the HPI.    Physical Exam Updated Vital Signs BP (!) 135/99 (BP Location: Left Arm)   Pulse 77   Temp 98.7 F (37.1 C) (Oral)   Resp 18   LMP 07/08/2017   SpO2 99%   Physical Exam  Constitutional: She is oriented to person, place, and time. Vital signs are normal. She appears well-developed and well-nourished.  Non-toxic appearance. No distress.  Afebrile, nontoxic, NAD  HENT:  Head: Normocephalic and atraumatic.  Mouth/Throat: Oropharynx is clear and moist and mucous membranes are normal.  Eyes: Conjunctivae and EOM are normal. Right eye exhibits no discharge. Left eye exhibits no discharge.  Neck: Normal range of motion. Neck supple.  Cardiovascular: Normal rate, regular rhythm, normal heart sounds and intact distal pulses. Exam reveals no gallop and no friction rub.  No murmur heard. Pulmonary/Chest: Effort normal and breath sounds normal. No respiratory distress. She has no decreased breath sounds. She has no wheezes. She  has no rhonchi. She has no rales.  Abdominal: Soft. Normal appearance and bowel sounds are normal. She exhibits no distension. There is no tenderness. There is no rigidity, no rebound, no guarding, no CVA tenderness, no tenderness at McBurney's point and negative Murphy's sign.  Musculoskeletal: Normal range of motion.  Neurological: She is alert and oriented to person, place, and time. She has normal strength. No sensory deficit.  Skin: Skin is warm, dry and intact. No rash noted.  Psychiatric: Her affect is blunt. She is actively hallucinating. She expresses no homicidal and no suicidal ideation. She expresses no suicidal plans and no homicidal plans.  Flat affect, but pleasant and cooperative. Endorsing auditory hallucinations, denies SI, HI, or VH, doesn't seem to be responding to internal stimuli.   Nursing note and vitals reviewed.    ED Treatments / Results  Labs (all labs ordered are listed, but only abnormal results are displayed) Labs Reviewed  COMPREHENSIVE METABOLIC PANEL - Abnormal; Notable for the following components:  Result Value   Glucose, Bld 104 (*)    All other components within normal limits  ACETAMINOPHEN LEVEL - Abnormal; Notable for the following components:   Acetaminophen (Tylenol), Serum <10 (*)    All other components within normal limits  RAPID URINE DRUG SCREEN, HOSP PERFORMED - Abnormal; Notable for the following components:   Benzodiazepines POSITIVE (*)    All other components within normal limits  CBC WITH DIFFERENTIAL/PLATELET  ETHANOL  SALICYLATE LEVEL  PREGNANCY, URINE    EKG  EKG Interpretation None       Radiology No results found.  Procedures Procedures (including critical care time)  Medications Ordered in ED Medications  ALPRAZolam (XANAX) tablet 0.5 mg (not administered)  ALPRAZolam (XANAX) tablet 1 mg (not administered)  escitalopram (LEXAPRO) tablet 10 mg (not administered)  sertraline (ZOLOFT) tablet 200 mg (not  administered)  temazepam (RESTORIL) capsule 15 mg (not administered)  traZODone (DESYREL) tablet 300 mg (not administered)  tretinoin (RETIN-A) 0.025 % cream 1 application (not administered)  zolpidem (AMBIEN) tablet 10 mg (not administered)  acetaminophen (TYLENOL) tablet 650 mg (not administered)  ondansetron (ZOFRAN) tablet 4 mg (not administered)  alum & mag hydroxide-simeth (MAALOX/MYLANTA) 200-200-20 MG/5ML suspension 30 mL (not administered)     Initial Impression / Assessment and Plan / ED Course  I have reviewed the triage vital signs and the nursing notes.  Pertinent labs & imaging results that were available during my care of the patient were reviewed by me and considered in my medical decision making (see chart for details).     27 y.o. female here under IVC, reportedly not taking care of herself and having auditory hallucinations telling her to commit suicide. Pt reports that she has auditory hallucinations but that they just "insult her" but don't tell her to do anything. She denies SI/HI/VH, drug use, EtOH use, or tobacco use. She reports compliance with her meds, but can't recall one of them and can't recall all of the doses. Denies medical complaints. On exam, very calm and cooperative, in NAD, with benign physical exam. Will get psych clearance labs and reassess shortly.   6:37 PM CBC w/diff WNL. CMP WNL. EtOH level undetectable. Salicylate and acetaminophen levels WNL. Upreg neg. UDS with +benzos as expected from her meds, but otherwise negative. Pt medically cleared at this time. Psych hold orders and home med orders placed. Please see TTS notes for further documentation of care/dispo. Pt stable at time of med clearance.     Final Clinical Impressions(s) / ED Diagnoses   Final diagnoses:  Hallucinations  Involuntary commitment  Medical clearance for psychiatric admission    ED Discharge Orders    [address]       [address], [place] ArsenalMercedes, [place] JerseyPA-C 07/22/17 Paulo Fruit1838    Charlynne PanderYao,  David Hsienta, MD 07/22/17 [phone]

## 2017-07-22 NOTE — ED Notes (Signed)
Bed: WTR9 Expected date:  Expected time:  Means of arrival:  Comments: 

## 2017-07-22 NOTE — ED Notes (Signed)
Bed: [id number] Expected date:  Expected time:  Means of arrival:  Comments: rudalph

## 2017-07-23 ENCOUNTER — Inpatient Hospital Stay (HOSPITAL_COMMUNITY)
Admission: AD | Admit: 2017-07-23 | Discharge: 2017-07-27 | DRG: 885 | Disposition: A | Discharge status: Home / Self Care | Payer: Medicaid Other | Source: Intra-hospital | Attending: Psychiatry | Admitting: Psychiatry

## 2017-07-23 ENCOUNTER — Encounter (HOSPITAL_COMMUNITY): Payer: Self-pay | Admitting: *Deleted

## 2017-07-23 ENCOUNTER — Other Ambulatory Visit: Payer: Self-pay

## 2017-07-23 DIAGNOSIS — F259 Schizoaffective disorder, unspecified: Secondary | ICD-10-CM | POA: Diagnosis present

## 2017-07-23 DIAGNOSIS — Z818 Family history of other mental and behavioral disorders: Secondary | ICD-10-CM

## 2017-07-23 DIAGNOSIS — F17210 Nicotine dependence, cigarettes, uncomplicated: Secondary | ICD-10-CM

## 2017-07-23 DIAGNOSIS — G4700 Insomnia, unspecified: Secondary | ICD-10-CM | POA: Diagnosis present

## 2017-07-23 DIAGNOSIS — Z79899 Other long term (current) drug therapy: Secondary | ICD-10-CM

## 2017-07-23 DIAGNOSIS — Z888 Allergy status to other drugs, medicaments and biological substances status: Secondary | ICD-10-CM | POA: Diagnosis not present

## 2017-07-23 DIAGNOSIS — F209 Schizophrenia, unspecified: Secondary | ICD-10-CM

## 2017-07-23 DIAGNOSIS — F329 Major depressive disorder, single episode, unspecified: Secondary | ICD-10-CM | POA: Diagnosis not present

## 2017-07-23 DIAGNOSIS — Z8659 Personal history of other mental and behavioral disorders: Secondary | ICD-10-CM

## 2017-07-23 DIAGNOSIS — R450 Nervousness: Secondary | ICD-10-CM | POA: Diagnosis not present

## 2017-07-23 DIAGNOSIS — F419 Anxiety disorder, unspecified: Secondary | ICD-10-CM | POA: Diagnosis present

## 2017-07-23 DIAGNOSIS — F258 Other schizoaffective disorders: Secondary | ICD-10-CM

## 2017-07-23 LAB — URINALYSIS, ROUTINE W REFLEX MICROSCOPIC
Bilirubin Urine: NEGATIVE
Glucose, UA: NEGATIVE mg/dL
Hgb urine dipstick: NEGATIVE
Ketones, ur: NEGATIVE mg/dL
Nitrite: NEGATIVE
Protein, ur: NEGATIVE mg/dL
Specific Gravity, Urine: 1.041 — ABNORMAL HIGH (ref 1.005–1.030)
pH: 5 (ref 5.0–8.0)

## 2017-07-23 MED ORDER — LORAZEPAM 1 MG PO TABS: 1.0000 mg | ORAL_TABLET | ORAL | Status: DC | PRN

## 2017-07-23 MED ORDER — MAGNESIUM HYDROXIDE 400 MG/5ML PO SUSP: 30.0000 mL | Freq: Every day | ORAL | Status: DC | PRN

## 2017-07-23 MED ORDER — OLANZAPINE 5 MG PO TBDP
5.0000 mg | ORAL_TABLET | Freq: Three times a day (TID) | ORAL | Status: DC | PRN
  Administered 2017-07-24: 5 mg via ORAL
  Filled 2017-07-23: qty 1

## 2017-07-23 MED ORDER — ACETAMINOPHEN 325 MG PO TABS
650.0000 mg | ORAL_TABLET | Freq: Four times a day (QID) | ORAL | Status: DC | PRN
  Administered 2017-07-23 – 2017-07-27 (×7): 650 mg via ORAL
  Filled 2017-07-23 (×7): qty 2

## 2017-07-23 MED ORDER — ALPRAZOLAM 0.5 MG PO TABS
0.5000 mg | ORAL_TABLET | Freq: Three times a day (TID) | ORAL | Status: DC
  Administered 2017-07-23 – 2017-07-24 (×3): 0.5 mg via ORAL
  Filled 2017-07-23 (×3): qty 1

## 2017-07-23 MED ORDER — IBUPROFEN 600 MG PO TABS
600.0000 mg | ORAL_TABLET | Freq: Four times a day (QID) | ORAL | Status: DC | PRN
  Administered 2017-07-24 – 2017-07-26 (×5): 600 mg via ORAL
  Filled 2017-07-23 (×5): qty 1

## 2017-07-23 MED ORDER — ALUM & MAG HYDROXIDE-SIMETH 200-200-20 MG/5ML PO SUSP
30.0000 mL | ORAL | Status: DC | PRN
  Administered 2017-07-25: 30 mL via ORAL
  Filled 2017-07-23: qty 30

## 2017-07-23 MED ORDER — ZIPRASIDONE MESYLATE 20 MG IM SOLR: 20.0000 mg | INTRAMUSCULAR | Status: DC | PRN

## 2017-07-23 MED ORDER — SERTRALINE HCL 100 MG PO TABS
200.0000 mg | ORAL_TABLET | Freq: Every day | ORAL | Status: DC
  Administered 2017-07-24 – 2017-07-27 (×4): 200 mg via ORAL
  Filled 2017-07-23 (×5): qty 2
  Filled 2017-07-23: qty 4

## 2017-07-23 MED ORDER — TEMAZEPAM 15 MG PO CAPS
15.0000 mg | ORAL_CAPSULE | Freq: Every day | ORAL | Status: DC
  Administered 2017-07-23: 15 mg via ORAL
  Filled 2017-07-23: qty 1

## 2017-07-23 MED ORDER — ESCITALOPRAM OXALATE 10 MG PO TABS
10.0000 mg | ORAL_TABLET | Freq: Every day | ORAL | Status: DC
  Filled 2017-07-23: qty 1

## 2017-07-23 MED ORDER — TRAZODONE HCL 150 MG PO TABS
300.0000 mg | ORAL_TABLET | Freq: Every day | ORAL | Status: DC
  Administered 2017-07-23 – 2017-07-26 (×4): 300 mg via ORAL
  Filled 2017-07-23 (×5): qty 2

## 2017-07-23 MED ORDER — HYDROXYZINE HCL 25 MG PO TABS
25.0000 mg | ORAL_TABLET | Freq: Three times a day (TID) | ORAL | Status: DC | PRN
  Administered 2017-07-23 – 2017-07-24 (×2): 25 mg via ORAL
  Filled 2017-07-23 (×2): qty 1

## 2017-07-23 MED ORDER — TRAZODONE HCL 50 MG PO TABS: 50.0000 mg | ORAL_TABLET | Freq: Every evening | ORAL | Status: DC | PRN

## 2017-07-23 NOTE — Plan of Care (Signed)
  Safety: Periods of time without injury will increase 07/23/2017 2141 - Progressing by Delos HaringPhillips, Michael A, RN Note Pt safe on the unit at this time

## 2017-07-23 NOTE — ED Notes (Signed)
[facility]. Personal property given to [facility] for transport. Pt ambulatory off unit.

## 2017-07-23 NOTE — Progress Notes (Signed)
Margaret Farley is a [age] year old female pt admitted on involuntary basis. On admission, Margaret Farley appears anxious and fidgety. She spoke about being here because she is feeing paranoid and hearing voices. She reports that she is taking her medications and denies any substance abuse issues. [facility] She reports that she does not have a PCP but does have a psychiatrist who prescribes her medications. She reports that she lives with her parents and reports that she will go back there once is discharged. Margaret Farley was oriented to the unit and safety maintained.

## 2017-07-23 NOTE — Progress Notes (Signed)
D: Pt denies SI/HI/AVH. Pt is pleasant and cooperative. Pt focused on medications, "I'm supposed to have Ambien and xanax".   A: Pt was offered support and encouragement. Pt was given scheduled medications. Pt was encourage to attend groups. Q 15 minute checks were done for safety.   R: safety maintained on unit.

## 2017-07-23 NOTE — Progress Notes (Signed)
Writer introduced self to pt. Pt reported AH. Pt expressed that she is a schizophrenic and she always hear voices. Pt expressed increased anxiety. Writer provided pt with vistaril for anxiety. Pt denies SI and verbally contract for safety.

## 2017-07-23 NOTE — Tx Team (Signed)
Initial Treatment Plan 07/23/2017 3:34 PM Margaret Farley ZDG:[id number]    PATIENT STRESSORS: Medication change or noncompliance Other: not sleeping well   PATIENT STRENGTHS: Ability for insight Average or above average intelligence Capable of independent living General fund of knowledge Motivation for treatment/growth Supportive family/friends   PATIENT IDENTIFIED PROBLEMS: Anxiety Paranoia Suicidal thoughts "Work on anxiety and control being paranoid"                     DISCHARGE CRITERIA:  Ability to meet basic life and health needs Improved stabilization in mood, thinking, and/or behavior Verbal commitment to aftercare and medication compliance  PRELIMINARY DISCHARGE PLAN: Attend aftercare/continuing care group Return to previous living arrangement  PATIENT/FAMILY INVOLVEMENT: This treatment plan has been presented to and reviewed with the patient, Margaret LericheMary K Farley, and/or family member, .  The patient and family have been given the opportunity to ask questions and make suggestions.  McNichol, FaisonBrook Wayne, [place]RN 07/23/2017, 3:34 PM

## 2017-07-23 NOTE — Consult Note (Addendum)
Margaret Farley Consult   Reason for Consult: Suicidal Ideation Referring Physician:  EDP Patient Identification: Margaret Farley MRN:  [id number] Principal Diagnosis: Schizophrenia, chronic condition with acute exacerbation [facility]) Diagnosis:   Patient Active Problem List   Diagnosis Date Noted  . Schizophrenia, chronic condition with acute exacerbation ([place]) [F20.9] 07/23/2017    Total Time spent with patient: 30 minutes  HPI:   Margaret Farley is a 27 y.o. female patient admitted to the ED under IVC initiated by her mother for concern of schizophrenia exacerbation increasing over the last 2 weeks. Per IVC, patient "is not taking care of her personal hygiene, hearing voices that are telling her to commit suicide. Voices are saying just do it tonight, what are you waiting for. Respondent believes she has speakers in ears. Voices are of every person she knows and they all say what are you waiting for. We all want you to die because you deserve it."  Patient notes that her voices are very insistent and insulting. Patient states that she has been previously admitted to a psychiatric hospital for this same issue in 2018. Patient endorses an inability to take any medication that helps with the voices as nothing has helped her since she has come off of Risperdal. Patient specifically states that prior use of Risperdal helped her significantly decrease how much she heard the voices. Patient states that she is beginning to feel helpless. Pt states she has been able to sleep very little due to her anxiety and auditory hallucinations. Patient states that she does not have any suicidal ideation, and does not want to hurt herself. Patient denies thoughts of hurting others. Patient requesting inpatient assistance with this issue. Pt would benefit from an inpatient psychiatric admission for medication management and crisis stabilization.   Past Psychiatric History:  Schizophrenia General Anxiety  Disorder  Risk to Self: Suicidal Ideation: No Suicidal Intent: No Is patient at risk for suicide?: No Suicidal Plan?: No Access to Means: No What has been your use of drugs/alcohol within the last 12 months?: denies use  Triggers for Past Attempts: None known Intentional Self Injurious Behavior: None Risk to Others: Homicidal Ideation: No Thoughts of Harm to Others: No Current Homicidal Intent: No Current Homicidal Plan: No Access to Homicidal Means: No History of harm to others?: No Assessment of Violence: None Noted Does patient have access to weapons?: No Criminal Charges Pending?: No Does patient have a court date: No Prior Inpatient Therapy: Prior Inpatient Therapy: Yes Prior Therapy Dates: 2018, 2017 Prior Therapy Facilty/Provider(s): [place], [place], [place] Reason for Treatment: SCHIZOPHRENIA  Prior Outpatient Therapy: Prior Outpatient Therapy: Yes Prior Therapy Dates: current Prior Therapy Facilty/Provider(s): [facility] Reason for Treatment: Schizophrenia  Does patient have an ACCT team?: No Does patient have Intensive In-House Services?  : No Does patient have [facility] services? : Yes Does patient have P4CC services?: No  Past Medical History:  Past Medical History:  Diagnosis Date  . ADD (attention deficit disorder)   . Anxiety   . Depression   . Schizophrenia ([place])    History reviewed. No pertinent surgical history. Family History:  Family History  Problem Relation Age of Onset  . Heart disease Father        MI age unknown  . Cancer Maternal Grandmother        breast   Family Psychiatric  History:  Mother has depression Father has depression  Social History:  Social History   Substance and Sexual Activity  Alcohol Use No     Social History   Substance and Sexual Activity  Drug Use No    Social History   Socioeconomic History  . Marital status: Single    Spouse name: None  . Number of children: None  . Years of education: None   . Highest education level: None  Social Needs  . Financial resource strain: None  . Food insecurity - worry: None  . Food insecurity - inability: None  . Transportation needs - medical: None  . Transportation needs - non-medical: None  Occupational History  . None  Tobacco Use  . Smoking status: Current Some Day Smoker    Packs/day: 0.00    Types: Cigarettes  . Smokeless tobacco: Never Used  Substance and Sexual Activity  . Alcohol use: No  . Drug use: No  . Sexual activity: No  Other Topics Concern  . 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 Benadryl Allergy  [Diphenhydramine] Other (See Comments)    Restless leg syndrome    Labs:  Results for orders placed or performed during the hospital encounter of 07/22/17 (from the past 48 hour(s))  CBC w/diff     Status: None   Collection Time: 07/22/17  5:26 PM  Result Value Ref Range   WBC 6.5 4.0 - 10.5 K/uL   RBC 4.35 3.87 - 5.11 MIL/uL   Hemoglobin 13.9 12.0 - 15.0 g/dL   HCT 42.4 36.0 - 46.0 %   MCV 97.5 78.0 - 100.0 fL   MCH 32.0 26.0 - 34.0 pg   MCHC 32.8 30.0 - 36.0 g/dL   RDW 12.4 11.5 - 15.5 %   Platelets 269 150 - 400 K/uL   Neutrophils Relative % 42 %   Neutro Abs 2.7 1.7 - 7.7 K/uL   Lymphocytes Relative 52 %   Lymphs Abs 3.4 0.7 - 4.0 K/uL   Monocytes Relative 5 %   Monocytes Absolute 0.4 0.1 - 1.0 K/uL   Eosinophils Relative 1 %   Eosinophils Absolute 0.1 0.0 - 0.7 K/uL   Basophils Relative 0 %   Basophils Absolute 0.0 0.0 - 0.1 K/uL    Comment: Performed at [facility], [place] [address], [place],  [address]  Comprehensive metabolic panel     Status: Abnormal   Collection Time: 07/22/17  5:26 PM  Result Value Ref Range   Sodium 139 135 - 145  mmol/L   Potassium 4.1 3.5 - 5.1 mmol/L   Chloride 103 101 - 111 mmol/L   CO2 26 22 - 32 mmol/L   Glucose, Bld 104 (H) 65 - 99 mg/dL   BUN 20 6 - 20 mg/dL   Creatinine, Ser 0.63 0.44 - 1.00 mg/dL   Calcium 9.4 8.9 - 10.3 mg/dL   Total Protein 7.7 6.5 - 8.1 g/dL   Albumin 4.4 3.5 - 5.0 g/dL   AST 22 15 - 41 U/L   ALT 14 14 - 54 U/L   Alkaline Phosphatase 55 38 - 126 U/L   Total Bilirubin 0.7 0.3 - 1.2 mg/dL   GFR calc non Af Amer >60 >60 mL/min   GFR calc Af  Amer >60 >60 mL/min    Comment: (NOTE) The eGFR has been calculated using the CKD EPI equation. This calculation has not been validated in all clinical situations. eGFR's persistently <60 mL/min signify possible Chronic Kidney Disease.    Anion gap 10 5 - 15    Comment: Performed at [facility], [place] [address] [place], [place] [address]  Ethanol     Status: None   Collection Time: 07/22/17  5:26 PM  Result Value Ref Range   Alcohol, Ethyl (B) <10 <10 mg/dL    Comment:        LOWEST DETECTABLE LIMIT FOR SERUM ALCOHOL IS 10 mg/dL FOR MEDICAL PURPOSES ONLY Performed at [facility], [place] [address], [place], [place] [address]   Salicylate level     Status: None   Collection Time: 07/22/17  5:26 PM  Result Value Ref Range   Salicylate Lvl <8.3 2.8 - 30.0 mg/dL    Comment: Performed at [facility], [place] [address], [place], [place] [address]  Acetaminophen level     Status: Abnormal   Collection Time: 07/22/17  5:26 PM  Result Value Ref Range   Acetaminophen (Tylenol), Serum <10 (L) 10 - 30 ug/mL    Comment:        THERAPEUTIC CONCENTRATIONS VARY SIGNIFICANTLY. A RANGE OF 10-30 ug/mL MAY BE AN EFFECTIVE CONCENTRATION FOR MANY PATIENTS. HOWEVER, SOME ARE BEST TREATED AT CONCENTRATIONS OUTSIDE THIS RANGE. ACETAMINOPHEN CONCENTRATIONS >150 ug/mL AT 4 HOURS AFTER INGESTION AND >50 ug/mL AT 12 HOURS AFTER INGESTION ARE OFTEN ASSOCIATED WITH  TOXIC REACTIONS. Performed at [facility], [place] [address] [place], [place] [address]   Urine rapid drug screen (hosp performed)     Status: Abnormal   Collection Time: 07/22/17  5:32 PM  Result Value Ref Range   Opiates NONE DETECTED NONE DETECTED   Cocaine NONE DETECTED NONE DETECTED   Benzodiazepines POSITIVE (A) NONE DETECTED   Amphetamines NONE DETECTED NONE DETECTED   Tetrahydrocannabinol NONE DETECTED NONE DETECTED   Barbiturates NONE DETECTED NONE DETECTED    Comment: (NOTE) DRUG SCREEN FOR MEDICAL PURPOSES ONLY.  IF CONFIRMATION IS NEEDED FOR ANY PURPOSE, NOTIFY LAB WITHIN 5 DAYS. LOWEST DETECTABLE LIMITS FOR URINE DRUG SCREEN Drug Class                     Cutoff (ng/mL) Amphetamine and metabolites    1000 Barbiturate and metabolites    200 Benzodiazepine                 710 Tricyclics and metabolites     300 Opiates and metabolites        300 Cocaine and metabolites        300 THC                            50 Performed at [facility], [place] [address] [place], [place] [address]   Pregnancy, urine     Status: None   Collection Time: 07/22/17  5:45 PM  Result Value Ref Range   Preg Test, Ur NEGATIVE NEGATIVE    Comment:        THE SENSITIVITY OF THIS METHODOLOGY IS >20 mIU/mL. Performed at [facility], [place] [address], [place], [place] [address]   Urinalysis, Routine w reflex microscopic     Status: Abnormal   Collection Time: 07/22/17  5:45 PM  Result  Value Ref Range   Color, Urine YELLOW YELLOW   APPearance HAZY (A) CLEAR   Specific Gravity, Urine 1.041 (H) 1.005 - 1.030   pH 5.0 5.0 - 8.0   Glucose, UA NEGATIVE NEGATIVE mg/dL   Hgb urine dipstick NEGATIVE NEGATIVE   Bilirubin Urine NEGATIVE NEGATIVE   Ketones, ur NEGATIVE NEGATIVE mg/dL   Protein, ur NEGATIVE NEGATIVE mg/dL   Nitrite NEGATIVE NEGATIVE   Leukocytes, UA TRACE (A) NEGATIVE   RBC / HPF 0-5 0 - 5 RBC/hpf   WBC, UA 6-30 0 - 5  WBC/hpf   Bacteria, UA FEW (A) NONE SEEN   Squamous Epithelial / LPF 0-5 (A) NONE SEEN   Mucus PRESENT    Ca Oxalate Crys, UA PRESENT     Comment: Performed at [facility], [place] [address], [place], [place] [address]    Current Facility-Administered Medications  Medication Dose Route Frequency Provider Last Rate Last Dose  . acetaminophen (TYLENOL) tablet 650 mg  650 mg Oral Q4H PRN [address], [place], [place]   650 mg at 07/23/17 0525  . ALPRAZolam Duanne Moron) tablet 0.5 mg  0.5 mg Oral TID [address], [place], [place]   0.5 mg at 07/23/17 0762  . ALPRAZolam Duanne Moron) tablet 1 mg  1 mg Oral QHS [address] [place], [place]   1 mg at 07/22/17 2149  . alum & mag hydroxide-simeth (MAALOX/MYLANTA) 200-200-20 MG/5ML suspension 30 mL  30 mL Oral Q6H PRN Street, [place], [place]      . escitalopram (LEXAPRO) tablet 10 mg  10 mg Oral Daily [address] [place], [place]   10 mg at 07/23/17 2633  . ondansetron (ZOFRAN) tablet 4 mg  4 mg Oral Q8H PRN [address], [place], [place]      . sertraline (ZOLOFT) tablet 200 mg  200 mg Oral Daily [address] [place], [place]   200 mg at 07/23/17 3545  . temazepam (RESTORIL) capsule 15 mg  15 mg Oral [address] [place], [place]   15 mg at 07/22/17 2148  . traZODone (DESYREL) tablet 300 mg  300 mg Oral QHS [address] [place], [place]   300 mg at 07/22/17 2148  . tretinoin (RETIN-A) 6.256 % cream 1 application  1 application Topical QHS [address], [place], [place]       Current Outpatient Medications  Medication Sig Dispense Refill  . ALPRAZolam (XANAX) 0.5 MG tablet Take 0.5 mg by mouth 3 (three) times daily.  0  . ALPRAZolam (XANAX) 1 MG tablet Take 1 mg by mouth at bedtime.     Marland Kitchen escitalopram (LEXAPRO) 10 MG tablet Take 10 mg by mouth daily.  0  . Pseudoeph-Doxylamine-DM-APAP (NYQUIL PO) Take 6 capsules by mouth at bedtime.     . sertraline (ZOLOFT) 100 MG tablet Take 200 mg by mouth daily.     . temazepam (RESTORIL) 15 MG capsule Take 15 mg by mouth at bedtime.  0  . traZODone (DESYREL) 100  MG tablet Take 300 mg by mouth at bedtime.   0  . tretinoin (RETIN-A) 0.025 % cream Apply 1 application topically at bedtime.    Marland Kitchen zolpidem (AMBIEN) 10 MG tablet Take 15 mg by mouth at bedtime.      Musculoskeletal: Strength & Muscle Tone: within normal limits Gait & Station: normal Patient leans: N/A  Psychiatric Specialty Exam: Physical Exam  Nursing note and vitals reviewed. Constitutional: She is oriented to person, place, and time. She appears well-developed and well-nourished.  HENT:  Head: Normocephalic and atraumatic.  Neck: Normal range of motion.  Respiratory: Effort normal.  Musculoskeletal: Normal range of motion.  Neurological: She is alert and oriented to person, place, and time. She has normal strength.  Skin: Skin is warm and dry.  Psychiatric: She has a normal mood and affect. Her speech is normal. Thought content normal. She is withdrawn. Cognition and memory are normal. She expresses impulsivity.    Review of Systems  Psychiatric/Behavioral: Positive for hallucinations (AH). Negative for suicidal ideas.  All other systems reviewed and are negative.   Blood pressure 102/66, pulse 87, temperature 98.4 F (36.9 C), temperature source Oral, resp. rate 16, last menstrual period 07/08/2017, SpO2 98 %.There is no height or weight on file to calculate BMI.  General Appearance: Casual  Eye Contact:  Minimal  Speech:  Clear and Coherent  Volume:  Normal  Mood:  Irritable  Affect:  Congruent  Thought Process:  Coherent  Orientation:  Full (Time, Place, and Person)  Thought Content:  Paranoid Ideation  Suicidal Thoughts:  No  Homicidal Thoughts:  No  Memory:  Immediate;   Fair Recent;   Fair Remote;   Good  Judgement:  Poor  Insight:  Fair  Psychomotor Activity:  Normal  Concentration:  Concentration: Fair and Attention Span: Fair  Recall:  [employer] of Knowledge:  Good  Language:  Good  Akathisia:  No  Handed:  Right  AIMS (if indicated):   N/A  Assets:   Communication Skills Housing Physical Health Resilience Social Support  ADL's:  Intact  Cognition:  WNL  Sleep:   N/A     Treatment Plan Summary: Daily contact with patient to assess and evaluate symptoms and progress in treatment and Medication management (see MAR)  Disposition: Recommend psychiatric Inpatient admission when medically cleared. TTS to seek placement  Ethelene Hal, NP 07/23/2017 1:33 PM   Patient seen face-to-face for psychiatric evaluation, chart reviewed and case discussed with the physician extender and developed treatment plan. Reviewed the information documented and agree with the treatment plan.  Buford Dresser, DO 07/23/17 6:03 PM

## 2017-07-24 DIAGNOSIS — F419 Anxiety disorder, unspecified: Secondary | ICD-10-CM

## 2017-07-24 DIAGNOSIS — F209 Schizophrenia, unspecified: Principal | ICD-10-CM

## 2017-07-24 DIAGNOSIS — F17210 Nicotine dependence, cigarettes, uncomplicated: Secondary | ICD-10-CM

## 2017-07-24 DIAGNOSIS — R450 Nervousness: Secondary | ICD-10-CM

## 2017-07-24 DIAGNOSIS — Z818 Family history of other mental and behavioral disorders: Secondary | ICD-10-CM

## 2017-07-24 DIAGNOSIS — G4700 Insomnia, unspecified: Secondary | ICD-10-CM

## 2017-07-24 MED ORDER — GABAPENTIN 300 MG PO CAPS
300.0000 mg | ORAL_CAPSULE | Freq: Three times a day (TID) | ORAL | Status: DC
  Administered 2017-07-24 – 2017-07-27 (×9): 300 mg via ORAL
  Filled 2017-07-24 (×12): qty 1

## 2017-07-24 MED ORDER — LORAZEPAM 1 MG PO TABS
1.0000 mg | ORAL_TABLET | Freq: Four times a day (QID) | ORAL | Status: DC | PRN
  Administered 2017-07-27: 1 mg via ORAL
  Filled 2017-07-24 (×2): qty 1

## 2017-07-24 MED ORDER — RISPERIDONE 1 MG PO TABS
1.0000 mg | ORAL_TABLET | Freq: Two times a day (BID) | ORAL | Status: DC
  Administered 2017-07-24 – 2017-07-25 (×3): 1 mg via ORAL
  Filled 2017-07-24 (×5): qty 1

## 2017-07-24 MED ORDER — ESCITALOPRAM OXALATE 10 MG PO TABS
10.0000 mg | ORAL_TABLET | Freq: Every day | ORAL | Status: DC
  Administered 2017-07-24 – 2017-07-27 (×4): 10 mg via ORAL
  Filled 2017-07-24 (×5): qty 1

## 2017-07-24 MED ORDER — ZIPRASIDONE MESYLATE 20 MG IM SOLR: 20.0000 mg | Freq: Two times a day (BID) | INTRAMUSCULAR | Status: DC | PRN

## 2017-07-24 MED ORDER — HYDROXYZINE HCL 50 MG PO TABS
50.0000 mg | ORAL_TABLET | Freq: Four times a day (QID) | ORAL | Status: DC | PRN
  Administered 2017-07-25 – 2017-07-27 (×3): 50 mg via ORAL
  Filled 2017-07-24 (×3): qty 1

## 2017-07-24 MED ORDER — RISPERIDONE 2 MG PO TBDP: 2.0000 mg | ORAL_TABLET | Freq: Three times a day (TID) | ORAL | Status: DC | PRN

## 2017-07-24 MED ORDER — HYDROXYZINE HCL 50 MG PO TABS: 50.0000 mg | ORAL_TABLET | Freq: Four times a day (QID) | ORAL | Status: DC | PRN

## 2017-07-24 NOTE — [facility] Suicide Risk Assessment (Signed)
[facility]   Nursing information obtained from:    Demographic factors:    Current Mental Status:    Loss Factors:    Historical Factors:    Risk Reduction Factors:     Total Time spent with patient: 1 hour Principal Problem: Schizophrenia, chronic with acute exacerbation ([facility]) Diagnosis:   Patient Active Problem List   Diagnosis Date Noted  . Schizophrenia, chronic condition with acute exacerbation ([facility]) [F20.9] 07/23/2017  . Schizophrenia, chronic with acute exacerbation ([facility]) [F20.9] 07/23/2017   Subjective Data: see H&P for details  Continued Clinical Symptoms:  Alcohol Use Disorder Identification Test Final Score (AUDIT): 0 The "Alcohol Use Disorders Identification Test", Guidelines for Use in Primary Care, Second Edition.  World Science writerHealth Organization [facility](WHO). Score between 0-7:  no or low risk or alcohol related problems. Score between 8-15:  moderate risk of alcohol related problems. Score between 16-19:  high risk of alcohol related problems. Score 20 or above:  warrants further diagnostic evaluation for alcohol dependence and treatment.   CLINICAL FACTORS:   Severe Anxiety and/or Agitation Schizophrenia:   Command hallucinatons Less than 29 years old Currently Psychotic   Psychiatric Specialty Exam: Physical Exam  Nursing note and vitals reviewed.   ROS - see H&P for details  Blood pressure 102/71, pulse (!) 104, temperature 98.3 F (36.8 C), temperature source Oral, resp. rate 16, height 5\' 2"  (1.575 m), weight 59.9 kg (132 lb), last menstrual period 07/08/2017.Body mass index is 24.14 kg/m.     COGNITIVE FEATURES THAT CONTRIBUTE TO RISK:  None    SUICIDE RISK:   Minimal: No identifiable suicidal ideation.  Patients presenting with no risk factors but with morbid ruminations; may be classified as minimal risk based on the severity of the depressive symptoms  PLAN OF CARE: See H&P for details  I certify that inpatient services  furnished can reasonably be expected to improve the patient's condition.   Micheal Likenshristopher T [place], MD 07/24/2017, 3:45 PM

## 2017-07-24 NOTE — H&P (Signed)
Psychiatric Admission Assessment Adult  Patient Identification: Margaret Farley MRN:  [id number] Date of Evaluation:  07/24/2017 Chief Complaint:  SCHIZOPHRENIA Principal Diagnosis: Schizophrenia, chronic with acute exacerbation ([place]) Diagnosis:   Patient Active Problem List   Diagnosis Date Noted  . Schizophrenia, chronic condition with acute exacerbation ([place]) [F20.9] 07/23/2017  . Schizophrenia, chronic with acute exacerbation ([place]) [F20.9] 07/23/2017   History of Present Illness:   Margaret Farley is a 27 y/o F with history of schizophrenia who was admitted on IVC placed by her parents due to worsening symptoms of psychosis including auditory hallucinations commanding her to kill herself and worsening personal hygiene.  Upon initial presentation, pt shares, "I'm schizophrenic and I only have auditory hallucinations. My current medications hasn't been keeping them at [place], and my anxiety has gotten really bad. It's been going on for about a week and a half. I can't sleep at night; I only sleep about 3 or 4 hours and most of that is during the day because my voices are keeping me up at night." Pt is not able to identify a specific trigger or stressor which she associates with her worsened AH. She describes her AH as "insulting" or "derogatory" but she reports they are not command in nature. She denies SI/HI/VH. She endorses some paranoia that someone was going to break into her home. She endorses depressed mood, and decreased appetite but she denies other symptoms of depression. She denies symptoms of mania, OCD, and PTSD. She denies all illicit substance use.  Discussed with patient about treatment options. She notes her current antipsychotic has not been helpful and she would like to attempt trial of risperdal, which she notes has been helpful for her in the past. Pt also reports she takes with good adherence zoloft 270m qDay, lexapro 170mqDay, ambien 1071mhs, trazodone 100m100ms, xanax 0.5mg 62m +  1mg q64m and temazepam 15mg q57mShe also has been prescribed adderall in the recent months but reports she has not been taking it. Discussed with patient about combining benzodiazepines as a risk for respiratory depression, and pt agreed to discontinue xanax and temazepam with plan to attempt trial of ativan 1mg po 61m prn anxiety. She will also attempt larger dose of trazodone 300mg qhs38mch she had used in the past. Pt will also attempt trial of gabapentin for anxiety. Pt will have PRN atarax 50mg po q60mrn anxiety available for as needed treatment of anxiety and insomnia. Pt was in agreement with the above plan and she had no further questions, comments, or concerns. At time of interview she declines to sign in voluntarily and she declines to sign release for the medical team to contact her parents (with whom she lives) for additional collateral information.  Associated Signs/Symptoms: Depression Symptoms:  depressed mood, insomnia, fatigue, difficulty concentrating, anxiety, (Hypo) Manic Symptoms:  Hallucinations, Anxiety Symptoms:  Excessive Worry, Psychotic Symptoms:  Hallucinations: Auditory Ideas of Reference, Paranoia, PTSD Symptoms: NA Total Time spent with patient: 1 hour  Past Psychiatric History:  - previous diagnosis of schizophrenia - 2 previous inpatient [facility] Current outpatient provider [facility] Bir[facility] (had been in [facility] program but was graduated to outpatient) - No previous suicide attempt  Is the patient at risk to self? Yes.    Has the patient been a risk to self in the past 6 months? Yes.    Has the patient been a risk to self within the distant  past? Yes.    Is the patient a risk to others? No.  Has the patient been a risk to others in the past 6 months? No.  Has the patient been a risk to others within the distant past? No.   Prior Inpatient Therapy:   Prior Outpatient Therapy:    Alcohol Screening: 1.  How often do you have a drink containing alcohol?: Never 2. How many drinks containing alcohol do you have on a typical day when you are drinking?: 1 or 2 3. How often do you have six or more drinks on one occasion?: Never AUDIT-C Score: 0 4. How often during the last year have you found that you were not able to stop drinking once you had started?: Never 5. How often during the last year have you failed to do what was normally expected from you becasue of drinking?: Never 6. How often during the last year have you needed a first drink in the morning to get yourself going after a heavy drinking session?: Never 7. How often during the last year have you had a feeling of guilt of remorse after drinking?: Never 8. How often during the last year have you been unable to remember what happened the night before because you had been drinking?: Never 9. Have you or someone else been injured as a result of your drinking?: No 10. Has a relative or friend or a doctor or another health worker been concerned about your drinking or suggested you cut down?: No Alcohol Use Disorder Identification Test Final Score (AUDIT): 0 Intervention/Follow-up: AUDIT Score <7 follow-up not indicated Substance Abuse History in the last 12 months:  No. Consequences of Substance Abuse: NA Previous Psychotropic Medications: Yes  Psychological Evaluations: Yes  Past Medical History:  Past Medical History:  Diagnosis Date  . ADD (attention deficit disorder)   . Anxiety   . Depression   . Schizophrenia ([place])    History reviewed. No pertinent surgical history. Family History:  Family History  Problem Relation Age of Onset  . Heart disease Father        MI age unknown  . Cancer Maternal Grandmother        breast   Family Psychiatric  History: pt reports both parents have depression and anxiety. Nobody in the family has attempted/completed suicide.  Tobacco Screening: Have you used any form of tobacco in the last 30  days? (Cigarettes, Smokeless Tobacco, Cigars, and/or Pipes): Yes Tobacco use, Select all that apply: 4 or less cigarettes per day Are you interested in Tobacco Cessation Medications?: No, patient refused Counseled patient on smoking cessation including recognizing danger situations, developing coping skills and basic information about quitting provided: Refused/Declined practical counseling Social History: Pt was born and raised in the [place] area. She lives with her parents. 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 She is not working. She has never been married. She has no children. She denies legal and trauma history. Social History   Substance and Sexual Activity  Alcohol Use No     Social History   Substance and Sexual Activity  Drug Use No    Additional Social History: What is your sexual orientation?: strzight Does patient have children?: No                         Allergies:   Allergies  Allergen Reactions  . Benadryl Allergy  [Diphenhydramine] Other (See Comments)    Restless leg  syndrome   Lab Results:  Results for orders placed or performed during the hospital encounter of 07/22/17 (from the past 48 hour(s))  CBC w/diff     Status: None   Collection Time: 07/22/17  5:26 PM  Result Value Ref Range   WBC 6.5 4.0 - 10.5 K/uL   RBC 4.35 3.87 - 5.11 MIL/uL   Hemoglobin 13.9 12.0 - 15.0 g/dL   HCT 42.4 36.0 - 46.0 %   MCV 97.5 78.0 - 100.0 fL   MCH 32.0 26.0 - 34.0 pg   MCHC 32.8 30.0 - 36.0 g/dL   RDW 12.4 11.5 - 15.5 %   Platelets 269 150 - 400 K/uL   Neutrophils Relative % 42 %   Neutro Abs 2.7 1.7 - 7.7 K/uL   Lymphocytes Relative 52 %   Lymphs Abs 3.4 0.7 - 4.0 K/uL   Monocytes Relative 5 %   Monocytes Absolute 0.4 0.1 - 1.0 K/uL   Eosinophils Relative 1 %   Eosinophils Absolute 0.1 0.0 - 0.7 K/uL   Basophils Relative 0 %   Basophils Absolute 0.0 0.0 - 0.1 K/uL    Comment: Performed at [facility], [place] [address] [place], [place] [address]  Comprehensive metabolic panel     Status: Abnormal   Collection Time: 07/22/17  5:26 PM  Result Value Ref Range   Sodium 139 135 - 145 mmol/L   Potassium 4.1 3.5 - 5.1 mmol/L   Chloride 103 101 - 111 mmol/L   CO2 26 22 - 32 mmol/L   Glucose, Bld 104 (H) 65 - 99 mg/dL   BUN 20 6 - 20 mg/dL   Creatinine, Ser 0.63 0.44 - 1.00 mg/dL   Calcium 9.4 8.9 - 10.3 mg/dL   Total Protein 7.7 6.5 - 8.1 g/dL   Albumin 4.4 3.5 - 5.0 g/dL   AST 22 15 - 41 U/L   ALT 14 14 - 54 U/L   Alkaline Phosphatase 55 38 - 126 U/L   Total Bilirubin 0.7 0.3 - 1.2 mg/dL   GFR calc non Af Amer >60 >60 mL/min   GFR calc Af Amer >60 >60 mL/min    Comment: (NOTE) The eGFR has been calculated using the CKD EPI equation. This calculation has not been validated in all clinical situations. eGFR's persistently <60 mL/min signify possible Chronic Kidney Disease.    Anion gap 10 5 - 15    Comment: Performed at [facility], [place] [address], [place], [place] [address]  Ethanol     Status: None   Collection Time: 07/22/17  5:26 PM  Result Value Ref Range   Alcohol, Ethyl (B) <10 <10 mg/dL    Comment:        LOWEST DETECTABLE LIMIT FOR SERUM ALCOHOL IS 10 mg/dL FOR MEDICAL PURPOSES ONLY Performed at [facility], [place] [address] [place], [place] [address]   Salicylate level     Status: None   Collection Time: 07/22/17  5:26 PM  Result Value Ref Range   Salicylate Lvl <0.2 2.8 - 30.0 mg/dL    Comment: Performed at [facility], [place] [address] [place], [place] [address]  Acetaminophen level     Status: Abnormal   Collection Time: 07/22/17  5:26 PM  Result Value Ref Range   Acetaminophen (Tylenol), Serum <10 (L) 10 - 30 ug/mL    Comment:        THERAPEUTIC CONCENTRATIONS VARY SIGNIFICANTLY. A RANGE OF 10-30 ug/mL MAY  BE AN EFFECTIVE CONCENTRATION FOR MANY PATIENTS. HOWEVER, SOME ARE BEST  TREATED AT CONCENTRATIONS OUTSIDE THIS RANGE. ACETAMINOPHEN CONCENTRATIONS >150 ug/mL AT 4 HOURS AFTER INGESTION AND >50 ug/mL AT 12 HOURS AFTER INGESTION ARE OFTEN ASSOCIATED WITH TOXIC REACTIONS. Performed at [facility], [place] [address], [place], [place] [address]   Urine rapid drug screen (hosp performed)     Status: Abnormal   Collection Time: 07/22/17  5:32 PM  Result Value Ref Range   Opiates NONE DETECTED NONE DETECTED   Cocaine NONE DETECTED NONE DETECTED   Benzodiazepines POSITIVE (A) NONE DETECTED   Amphetamines NONE DETECTED NONE DETECTED   Tetrahydrocannabinol NONE DETECTED NONE DETECTED   Barbiturates NONE DETECTED NONE DETECTED    Comment: (NOTE) DRUG SCREEN FOR MEDICAL PURPOSES ONLY.  IF CONFIRMATION IS NEEDED FOR ANY PURPOSE, NOTIFY LAB WITHIN 5 DAYS. LOWEST DETECTABLE LIMITS FOR URINE DRUG SCREEN Drug Class                     Cutoff (ng/mL) Amphetamine and metabolites    1000 Barbiturate and metabolites    200 Benzodiazepine                 106 Tricyclics and metabolites     300 Opiates and metabolites        300 Cocaine and metabolites        300 THC                            50 Performed at [facility], [place] [address] [place], [place] [address]   Pregnancy, urine     Status: None   Collection Time: 07/22/17  5:45 PM  Result Value Ref Range   Preg Test, Ur NEGATIVE NEGATIVE    Comment:        THE SENSITIVITY OF THIS METHODOLOGY IS >20 mIU/mL. Performed at [facility], [place] [address] [place], [place] [address]   Urinalysis, Routine w reflex microscopic     Status: Abnormal   Collection Time: 07/22/17  5:45 PM  Result Value Ref Range   Color, Urine YELLOW YELLOW   APPearance HAZY (A) CLEAR   Specific Gravity, Urine 1.041 (H) 1.005 - 1.030   pH 5.0 5.0 - 8.0   Glucose, UA NEGATIVE NEGATIVE mg/dL   Hgb urine dipstick NEGATIVE NEGATIVE   Bilirubin Urine NEGATIVE NEGATIVE   Ketones, ur  NEGATIVE NEGATIVE mg/dL   Protein, ur NEGATIVE NEGATIVE mg/dL   Nitrite NEGATIVE NEGATIVE   Leukocytes, UA TRACE (A) NEGATIVE   RBC / HPF 0-5 0 - 5 RBC/hpf   WBC, UA 6-30 0 - 5 WBC/hpf   Bacteria, UA FEW (A) NONE SEEN   Squamous Epithelial / LPF 0-5 (A) NONE SEEN   Mucus PRESENT    Ca Oxalate Crys, UA PRESENT     Comment: Performed at [facility], [place] [address], [place], [place] [address]    Blood Alcohol level:  Lab Results  Component Value Date   ETH <10 07/22/2017   ETH <5 93/81/8299    Metabolic Disorder Labs:  No results found for: HGBA1C, MPG No results found for: PROLACTIN No results found for: CHOL, TRIG, HDL, CHOLHDL, VLDL, LDLCALC  Current Medications: Current Facility-Administered Medications  Medication Dose Route Frequency Provider Last Rate Last Dose  . acetaminophen (TYLENOL) tablet 650 mg  650 mg Oral Q6H PRN Ethelene Hal, NP   [phone]  mg at 07/23/17 2253  . alum & mag hydroxide-simeth (MAALOX/MYLANTA) 200-200-20 MG/5ML suspension 30 mL  30 mL Oral Q4H PRN Ethelene Hal, NP      . escitalopram (LEXAPRO) tablet 10 mg  10 mg Oral Daily Pennelope Bracken, MD      . gabapentin (NEURONTIN) capsule 300 mg  300 mg Oral TID Pennelope Bracken, MD      . hydrOXYzine (ATARAX/VISTARIL) tablet 50 mg  50 mg Oral Q6H PRN Pennelope Bracken, MD      . ibuprofen (ADVIL,MOTRIN) tablet 600 mg  600 mg Oral Q6H PRN Patriciaann Clan E, PA-C   600 mg at 07/24/17 0539  . LORazepam (ATIVAN) tablet 1 mg  1 mg Oral Q6H PRN Pennelope Bracken, MD      . magnesium hydroxide (MILK OF MAGNESIA) suspension 30 mL  30 mL Oral Daily PRN Ethelene Hal, NP      . risperiDONE (RISPERDAL M-TABS) disintegrating tablet 2 mg  2 mg Oral Q8H PRN Pennelope Bracken, MD       Or  . ziprasidone (GEODON) injection 20 mg  20 mg Intramuscular Q12H PRN Pennelope Bracken, MD      . risperiDONE (RISPERDAL) tablet 1 mg  1 mg Oral BID  Pennelope Bracken, MD      . sertraline (ZOLOFT) tablet 200 mg  200 mg Oral Daily Ethelene Hal, NP   200 mg at 07/24/17 0732  . traZODone (DESYREL) tablet 300 mg  300 mg Oral QHS Ethelene Hal, NP   300 mg at 07/23/17 2130   PTA Medications: Medications Prior to Admission  Medication Sig Dispense Refill Last Dose  . ALPRAZolam (XANAX) 0.5 MG tablet Take 0.5 mg by mouth 3 (three) times daily.  0 07/21/2017 at Unknown time  . ALPRAZolam (XANAX) 1 MG tablet Take 1 mg by mouth at bedtime.    07/21/2017 at Unknown time  . escitalopram (LEXAPRO) 10 MG tablet Take 10 mg by mouth daily.  0 07/21/2017 at Unknown time  . Pseudoeph-Doxylamine-DM-APAP (NYQUIL PO) Take 6 capsules by mouth at bedtime.    07/21/2017 at Unknown time  . sertraline (ZOLOFT) 100 MG tablet Take 200 mg by mouth daily.    07/22/2017 at Unknown time  . temazepam (RESTORIL) 15 MG capsule Take 15 mg by mouth at bedtime.  0 Past Week at Unknown time  . traZODone (DESYREL) 100 MG tablet Take 300 mg by mouth at bedtime.   0 07/21/2017 at Unknown time  . tretinoin (RETIN-A) 0.025 % cream Apply 1 application topically at bedtime.   07/21/2017 at Unknown time  . zolpidem (AMBIEN) 10 MG tablet Take 15 mg by mouth at bedtime.   07/21/2017 at Unknown time    Musculoskeletal: Strength & Muscle Tone: within normal limits Gait & Station: normal Patient leans: N/A  Psychiatric Specialty Exam: Physical Exam  Nursing note and vitals reviewed.   Review of Systems  Constitutional: Negative for chills and fever.  Respiratory: Negative for cough and shortness of breath.   Cardiovascular: Negative for chest pain.  Gastrointestinal: Negative for abdominal pain, heartburn, nausea and vomiting.  Psychiatric/Behavioral: Positive for depression and hallucinations. Negative for suicidal ideas. The patient is nervous/anxious.     Blood pressure 102/71, pulse (!) 104, temperature 98.3 F (36.8 C), temperature source Oral, resp. rate 16,  height 5' 2"  (1.575 m), weight 59.9 kg (132 lb), last menstrual period 07/08/2017.Body mass index is 24.14 kg/m.  General Appearance: Disheveled  Eye Contact:  Good  Speech:  Clear and Coherent and Normal Rate  Volume:  Normal  Mood:  Anxious and Euthymic  Affect:  Congruent and Flat  Thought Process:  Coherent and Goal Directed  Orientation:  Full (Time, Place, and Person)  Thought Content:  Hallucinations: Auditory  Suicidal Thoughts:  No  Homicidal Thoughts:  No  Memory:  Immediate;   Fair Recent;   Fair Remote;   Fair  Judgement:  Impaired  Insight:  Lacking  Psychomotor Activity:  Normal  Concentration:  Concentration: Fair  Recall:  [employer] of Knowledge:  Fair  Language:  Fair  Akathisia:  No  Handed:    AIMS (if indicated):     Assets:  Communication Skills Desire for Improvement Financial Resources/Insurance Housing Physical Health Resilience Social Support  ADL's:  Intact  Cognition:  WNL  Sleep:  Number of Hours: 4.75    Treatment Plan Summary: Daily contact with patient to assess and evaluate symptoms and progress in treatment and Medication management  Observation Level/Precautions:  15 minute checks  Laboratory:  CBC Chemistry Profile HbAIC HCG UDS UA  Psychotherapy:  Encourage participation in groups and therapeutic milieu   Medications:   Start risperdal 75m po BID. Start gabapentin 3035mpo BID. Restart zoloft 20041mo qDay. Restart lexapro 104m8m qDay. DC temazepam. DC xanax. Start ativan 1mg 31mq6h prn moderate/severe anxiety. Change trazodone 100mg 72mto trazodone 300mg p2ms. DC ambien. Start atarax 50mg po59m prn mild anxiety/insomnia   Consultations:  none  Discharge Concerns:    Estimated LOS: 5-7 days  Other:     Physician Treatment Plan for Primary Diagnosis: Schizophrenia, chronic with acute exacerbation ([facility]) LonMiguel Barreraerm Goal(s): Improvement in symptoms so as ready for discharge  Short Term Goals: Compliance with  prescribed medications will improve  Physician Treatment Plan for Secondary Diagnosis: Principal Problem:   Schizophrenia, chronic with acute exacerbation ([facility])  LoGlenburnTerm Goal(s): Improvement in symptoms so as ready for discharge  Short Term Goals: Ability to identify and develop effective coping behaviors will improve  I certify that inpatient services furnished can reasonably be expected to improve the patient's condition.    ChristopPennelope Bracken/20193:25 PM

## 2017-07-24 NOTE — BHH Counselor (Signed)
Adult Comprehensive Assessment  Patient ID: Margaret Farley, female   DOB: 12-Mar-1991, 27 y.o.   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 What is atmosphere in current home: Comfortable, Supportive, Loving  Family History:  What is your sexual orientation?: strzight Does patient have children?: No  Childhood History:  By whom was/is the patient raised?: Both parents Description of patient's relationship with caregiver when they were a child: good Patient's description of current relationship with people who raised him/her: good Does patient have siblings?: No Did patient suffer any verbal/emotional/physical/sexual abuse as a child?: No Did patient suffer from severe childhood neglect?: No Has patient ever been sexually abused/assaulted/raped as an adolescent or adult?: No Was the patient ever a victim of a crime or a disaster?: No Witnessed domestic violence?: No Has patient been effected by domestic violence as an adult?: No  Education:  Highest grade of school patient has completed: 4 years undergrad plus 2 years Therapist, nutritionalLaw School Currently a Consulting civil engineerstudent?: No Learning disability?: No  Employment/Work Situation:   Employment situation: On disability Why is patient on disability: Schizophrenia How long has patient been on disability: 5 years Patient's job has been impacted by current illness: No Has patient ever been in the Eli Lilly and Companymilitary?: No Are There Guns or Other Weapons in Your Home?: No  Financial  Resources:   Surveyor, quantityinancial resources: Writereceives SSI, Medicaid Does patient have a Lawyerrepresentative payee or guardian?: No  Alcohol/Substance Abuse:   Alcohol/Substance Abuse Treatment Hx: Denies past history Has alcohol/substance abuse ever caused legal problems?: No  Social Support System:   Conservation officer, natureatient's Community Support System: Production assistant, radioGood Describe Community Support System: family, friends Type of faith/religion: N/A How does patient's faith help to cope with current illness?: N/A  Leisure/Recreation:   Leisure and Hobbies: watch movies, walk, read  Strengths/Needs:   What things does the patient do well?: good learner, good listner, loyal In what areas does patient struggle / problems for patient: easily influenced  Discharge Plan:   Does patient have access to transportation?: Yes Will patient be returning to same living situation after discharge?: Yes Currently receiving community mental health services: Yes ([facility]) Does patient have financial barriers related to discharge medications?: No  Summary/Recommendations:   Summary and Recommendations (to be completed by the evaluator): Margaret Farley is a 27 YO Caucasian female diagnosed with Schizophrenia.  She presents IVC'd by family due to increasing AH, including command hallucinations asking "Why don't you just do it?"  Margaret Farley is open to medication changes, and will return home and follow up with Dr Deatra JamesBarahni at [facility].  While here, she can benefit from crises stabilization, medication management, therapeutic milieu and coordination with outpatient provider.  Ida Rogueodney B North. 07/24/2017

## 2017-07-24 NOTE — BHH Group Notes (Signed)
LCSW Group Therapy Notes 07/24/2017 1:15pm Type of Therapy and Topic:  Group Therapy:  Communication Participation Level:  Minimal  Description of Group: Patients will identify how individuals communicate with one another appropriately and inappropriately.  Patients will be guided to discuss their thoughts, feelings and behaviors related to barriers when communicating.  The group will process together ways to execute positive and appropriate communication with attention given to how one uses behavior, tone and body language.  Patients will be encouraged to reflect on a situation where they were successfully able to communicate and what made this example successful.  Group will identify specific changes they are motivated to make in order to overcome communication barriers with self, peers, authority, and parents.  This group will be process-oriented with patients participating in exploration of their own experiences, giving and receiving support, and challenging self and other group members.   Therapeutic Goals 1. Patient will identify how people communicate (body language, facial expression, and electronics).  Group will also discuss tone, voice and how these impact what is communicated and what is received. 2. Patient will identify feelings (such as fear or worry), thought process and behaviors related to why people internalize feelings rather than express self openly. 3. Patient will identify two changes they are willing to make to overcome communication barriers 4. Members will then practice through role play how to communicate using I statements, I feel statements, and acknowledging feelings rather than displacing feelings on others Summary of Patient Progress:  Stayed the entire time, engaged throughout.  Minimal input. Stated that she has good supports outside of here-family friends-and wants to do a better job of communicating to them about things she needs help with.    Therapeutic  Modalities Cognitive Behavioral Therapy Motivational Interviewing Solution Focused Therapy  Ida RogueRodney B Vicky Schleich, LCSW 07/24/2017 1:49 PM

## 2017-07-24 NOTE — Progress Notes (Signed)
D: Pt denies SI/HI/AVH. Pt is pleasant and cooperative. Pt sleep a lot this evening, pt said she was doing ok. Pt went to sleep and has been sleeping most of the evening A: Pt was offered support and encouragement. Pt was given scheduled medications. Pt was encourage to attend groups. Q 15 minute checks were done for safety.   R: safety maintained on unit.

## 2017-07-24 NOTE — Progress Notes (Signed)
Patient denies SI, HI and AVH.  Patient has been noted on the unit engaged in unit activities and groups.  Patient was compliant with all medications and treatments.  Patient has had no incidents of behavioral dyscontrol and has had minimal need for staff redirection.   Assess patient for safety, offer medications as prescribed engage patient in 1:1 staff talks.   Patient able to contract for safety. Continue to monitor as prescribed.  

## 2017-07-24 NOTE — Progress Notes (Signed)
Did not attend group 

## 2017-07-24 NOTE — Progress Notes (Signed)
Recreation Therapy Notes  Date: 07/24/17 Time: 1000 Location: 500 Hall Dayroom  Group Topic: Self-Esteem  Goal Area(s) Addresses:  Patient will successfully identify positive attributes about themselves.  Patient will successfully identify benefit of improved self-esteem.   Intervention: Magazines, markers, colored pencils, construction paper, scissors, glue sticks  Activity: Collage about Me.  Patients were to create a collage that emphasized the positive qualities they posses, goals they hope to accomplish or what is important to them.  Education:  Self-Esteem, Building control surveyorDischarge Planning.   Education Outcome: Acknowledges education/In group clarification offered/Needs additional education  Clinical Observations/Feedback: Pt did not attend group.    Caroll RancherMarjette [name], LRT/CTRS         Caroll RancherLindsay, Marjette A 07/24/2017 11:44 AM

## 2017-07-24 NOTE — Plan of Care (Signed)
  Safety: Periods of time without injury will increase 07/24/2017 1957 - Progressing by Delos HaringPhillips, Michael A, RN Note Pt safe on the unit at this time

## 2017-07-24 NOTE — Progress Notes (Signed)
Recreation Therapy Notes  INPATIENT RECREATION THERAPY ASSESSMENT  Patient Details Name: Margaret LericheMary K Farley MRN: [id number] DOB: Oct 20, 1990 Today's Date: 07/24/2017       Information Obtained From: Patient  Able to Participate in Assessment/Interview: Yes  Patient Presentation: Alert, Oriented  Reason for Admission (Per Patient): Patient was admitted IVC   Patient Stressors: Patient was unable to identify stressors   Coping Skills:   Deep Breathing, Talk  Leisure Interests (2+):  Watching movies, playing with dog, family, taking walks   Frequency of Recreation/Participation: Weekly  Awareness of Community Resources:  Yes  Current Use: No  If no, Barriers?: Social  Expressed Interest in [employer] CorporationCommunity Resource Information: No  [place]   Patient Main Form of Transportation: Set [employer]Car  Patient Strengths:  Artistconcenrating, listening to others, learning   Patient Identified Areas of Improvement:  Patient was not able to identify any areas of improvements   Patient Goal for Hospitalization:  Patient was not able to identify a goal   Current SI (including self-harm):  No  Current HI:  No  Current AVH: No  Staff Intervention Plan: Group Attendance  Consent to Intern Participation: Yes  Margaret Farley, Recreation Therapy Intern   Margaret Farley 07/24/2017, 1:33 PM

## 2017-07-24 NOTE — [facility] Suicide Risk Assessment (Addendum)
[facility] INPATIENT:  Family/Significant Other Suicide Prevention Education  Suicide Prevention Education:  Patient Refusal for Family/Significant Other Suicide Prevention Education: The patient Margaret LericheMary K Farley has refused to provide written consent for family/significant other to be provided Family/Significant Other Suicide Prevention Education during admission and/or prior to discharge.  Physician notified.  3/7:  Pt signed a release.  CSW called mother Alene MiresSandy Lupi [phone].  Went over crises plan and treatment team recommendations.  No guns in the home.  Baldo DaubRodney B [facility] 07/24/2017, 4:01 PM

## 2017-07-25 LAB — LIPID PANEL
Cholesterol: 218 mg/dL — ABNORMAL HIGH (ref 0–200)
HDL: 51 mg/dL (ref >40)
LDL Cholesterol: 129 mg/dL — ABNORMAL HIGH (ref 0–99)
Total CHOL/HDL Ratio: 4.3 RATIO
Triglycerides: 192 mg/dL — ABNORMAL HIGH (ref <150)
VLDL: 38 mg/dL (ref 0–40)

## 2017-07-25 LAB — TSH: TSH: 1.608 u[IU]/mL (ref 0.350–4.500)

## 2017-07-25 LAB — HEMOGLOBIN A1C
Hgb A1c MFr Bld: 4.5 % — ABNORMAL LOW (ref 4.8–5.6)
Mean Plasma Glucose: 82.45 mg/dL

## 2017-07-25 MED ORDER — RISPERIDONE 2 MG PO TABS
2.0000 mg | ORAL_TABLET | Freq: Every day | ORAL | Status: DC
  Administered 2017-07-25 – 2017-07-26 (×2): 2 mg via ORAL
  Filled 2017-07-25 (×3): qty 1

## 2017-07-25 MED ORDER — RISPERIDONE 1 MG PO TABS
1.0000 mg | ORAL_TABLET | Freq: Every day | ORAL | Status: DC
  Administered 2017-07-26 – 2017-07-27 (×2): 1 mg via ORAL
  Filled 2017-07-25 (×3): qty 1

## 2017-07-25 NOTE — Progress Notes (Signed)
D: Pt denies SI/HI/AVH. Pt is pleasant and cooperative. Pt presents depressed, but stated her day was a little better. Pt appears paranoid at times, and pt keeps to herself most of the evening.   A: Pt was offered support and encouragement. Pt was given scheduled medications. Pt was encourage to attend groups. Q 15 minute checks were done for safety.   R:Pt attends groups and interacts well with peers and staff. Pt is taking medication. Pt has no complaints at this time.Pt receptive to treatment and safety maintained on unit.

## 2017-07-25 NOTE — Progress Notes (Signed)
Recreation Therapy Notes  Date: 07/25/17 Time: 0950 Location: 500 Hall Dayroom  Group Topic: Wellness  Goal Area(s) Addresses:  Patient will define components of whole wellness. Patient will verbalize benefit of whole wellness.  Behavioral Response: None  Intervention: Music, Exercise  Activity: Exercise.  LRT played music while leading patients through a series of exercises.  LRT lead patients through a round of stretches, then allowed each patient to lead an exercise of their choosing such as (squats, jumping jacks, jogging in place, etc).  LRT closed out group by leading patients through a cool down where they completed more stretches.  Education: Wellness, Building control surveyorDischarge Planning.   Education Outcome: Acknowledges education/In group clarification offered/Needs additional education.   Clinical Observations/Feedback:  Pt did not participate.  Pt sat and watched.      Caroll RancherMarjette [name], LRT/CTRS     Lillia AbedLindsay, Marjette A 07/25/2017 11:07 AM

## 2017-07-25 NOTE — Tx Team (Signed)
Interdisciplinary Treatment and Diagnostic Plan Update  07/25/2017 Time of Session: 8:27 AM  Margaret Farley MRN: [id number]  Principal Diagnosis: Schizophrenia, chronic with acute exacerbation ([place])  Secondary Diagnoses: Principal Problem:   Schizophrenia, chronic with acute exacerbation ([place])   Current Medications:  Current Facility-Administered Medications  Medication Dose Route Frequency Provider Last Rate Last Dose  . acetaminophen (TYLENOL) tablet 650 mg  650 mg Oral Q6H PRN Ethelene Hal, NP   650 mg at 07/24/17 1548  . alum & mag hydroxide-simeth (MAALOX/MYLANTA) 200-200-20 MG/5ML suspension 30 mL  30 mL Oral Q4H PRN Ethelene Hal, NP      . escitalopram (LEXAPRO) tablet 10 mg  10 mg Oral Daily Pennelope Bracken, MD   10 mg at 07/25/17 0739  . gabapentin (NEURONTIN) capsule 300 mg  300 mg Oral TID Maris Berger T, MD   300 mg at 07/25/17 0739  . hydrOXYzine (ATARAX/VISTARIL) tablet 50 mg  50 mg Oral Q6H PRN Pennelope Bracken, MD      . ibuprofen (ADVIL,MOTRIN) tablet 600 mg  600 mg Oral Q6H PRN Patriciaann Clan E, PA-C   600 mg at 07/25/17 0053  . LORazepam (ATIVAN) tablet 1 mg  1 mg Oral Q6H PRN Pennelope Bracken, MD      . magnesium hydroxide (MILK OF MAGNESIA) suspension 30 mL  30 mL Oral Daily PRN Ethelene Hal, NP      . risperiDONE (RISPERDAL M-TABS) disintegrating tablet 2 mg  2 mg Oral Q8H PRN Pennelope Bracken, MD       Or  . ziprasidone (GEODON) injection 20 mg  20 mg Intramuscular Q12H PRN Pennelope Bracken, MD      . risperiDONE (RISPERDAL) tablet 1 mg  1 mg Oral BID Pennelope Bracken, MD   1 mg at 07/25/17 0739  . sertraline (ZOLOFT) tablet 200 mg  200 mg Oral Daily Ethelene Hal, NP   200 mg at 07/25/17 0739  . traZODone (DESYREL) tablet 300 mg  300 mg Oral QHS Ethelene Hal, NP   300 mg at 07/25/17 0054    PTA Medications: Medications Prior to Admission  Medication Sig Dispense  Refill Last Dose  . ALPRAZolam (XANAX) 0.5 MG tablet Take 0.5 mg by mouth 3 (three) times daily.  0 07/21/2017 at Unknown time  . ALPRAZolam (XANAX) 1 MG tablet Take 1 mg by mouth at bedtime.    07/21/2017 at Unknown time  . escitalopram (LEXAPRO) 10 MG tablet Take 10 mg by mouth daily.  0 07/21/2017 at Unknown time  . Pseudoeph-Doxylamine-DM-APAP (NYQUIL PO) Take 6 capsules by mouth at bedtime.    07/21/2017 at Unknown time  . sertraline (ZOLOFT) 100 MG tablet Take 200 mg by mouth daily.    07/22/2017 at Unknown time  . temazepam (RESTORIL) 15 MG capsule Take 15 mg by mouth at bedtime.  0 Past Week at Unknown time  . traZODone (DESYREL) 100 MG tablet Take 300 mg by mouth at bedtime.   0 07/21/2017 at Unknown time  . tretinoin (RETIN-A) 0.025 % cream Apply 1 application topically at bedtime.   07/21/2017 at Unknown time  . zolpidem (AMBIEN) 10 MG tablet Take 15 mg by mouth at bedtime.   07/21/2017 at Unknown time    Patient Stressors: Medication change or noncompliance Other: not sleeping well  Patient Strengths: Ability for insight Average or above average intelligence Capable of independent living General fund of knowledge Motivation for treatment/growth Supportive family/friends  Treatment Modalities:  Medication Management, Group therapy, Case management,  1 to 1 session with clinician, Psychoeducation, Recreational therapy.   Physician Treatment Plan for Primary Diagnosis: Schizophrenia, chronic with acute exacerbation ([place]) Long Term Goal(s): Improvement in symptoms so as ready for discharge  Short Term Goals: Compliance with prescribed medications will improve Ability to identify and develop effective coping behaviors will improve  Medication Management: Evaluate patient's response, side effects, and tolerance of medication regimen.  Therapeutic Interventions: 1 to 1 sessions, Unit Group sessions and Medication administration.  Evaluation of Outcomes: Progressing  Physician Treatment Plan  for Secondary Diagnosis: Principal Problem:   Schizophrenia, chronic with acute exacerbation ([place])   Long Term Goal(s): Improvement in symptoms so as ready for discharge  Short Term Goals: Compliance with prescribed medications will improve Ability to identify and develop effective coping behaviors will improve  Medication Management: Evaluate patient's response, side effects, and tolerance of medication regimen.  Therapeutic Interventions: 1 to 1 sessions, Unit Group sessions and Medication administration.  Evaluation of Outcomes: Progressing   RN Treatment Plan for Primary Diagnosis: Schizophrenia, chronic with acute exacerbation ([place]) Long Term Goal(s): Knowledge of disease and therapeutic regimen to maintain health will improve  Short Term Goals: Ability to identify and develop effective coping behaviors will improve and Compliance with prescribed medications will improve  Medication Management: RN will administer medications as ordered by provider, will assess and evaluate patient's response and provide education to patient for prescribed medication. RN will report any adverse and/or side effects to prescribing provider.  Therapeutic Interventions: 1 on 1 counseling sessions, Psychoeducation, Medication administration, Evaluate responses to treatment, Monitor vital signs and CBGs as ordered, Perform/monitor CIWA, COWS, AIMS and Fall Risk screenings as ordered, Perform wound care treatments as ordered.  Evaluation of Outcomes: Progressing   LCSW Treatment Plan for Primary Diagnosis: Schizophrenia, chronic with acute exacerbation ([place]) Long Term Goal(s): Safe transition to appropriate next level of care at discharge, Engage patient in therapeutic group addressing interpersonal concerns.  Short Term Goals: Engage patient in aftercare planning with referrals and resources  Therapeutic Interventions: Assess for all discharge needs, 1 to 1 time with Social worker, Explore available  resources and support systems, Assess for adequacy in community support network, Educate family and significant other(s) on suicide prevention, Complete Psychosocial Assessment, Interpersonal group therapy.  Evaluation of Outcomes: Met Return home, follow up [facility] with Dr B   Progress in Treatment: Attending groups: Yes Participating in groups: Yes Taking medication as prescribed: Yes Toleration medication: Yes, no side effects reported at this time Family/Significant other contact made: No  Pt would not give permission Patient understands diagnosis: Yes AEB asking for help with her Schizophrenia Discussing patient identified problems/goals with staff: Yes Medical problems stabilized or resolved: Yes Denies suicidal/homicidal ideation: Yes Issues/concerns per patient self-inventory: None Other: N/A  New problem(s) identified: None identified at this time.   New Short Term/Long Term Goal(s): "I wasn't sleeping at home, so I am catching up on my rest here. The Risperdal helped me sleep very well last night, and I am happy with it."  Discharge Plan or Barriers:   Reason for Continuation of Hospitalization: Anxiety Hallucinations  Medication stabilization Suicidal ideation   Estimated Length of Stay: 3/11  Attendees: Patient: Margaret Farley 07/25/2017  8:27 AM  Physician: Maris Berger, MD 07/25/2017  8:27 AM  Nursing: Sena Hitch, RN 07/25/2017  8:27 AM  RN Care Manager: Lars Pinks, RN 07/25/2017  8:27 AM  Social Worker: Ripley Fraise 07/25/2017  8:27 AM  Recreational Therapist: Winfield Cunas 07/25/2017  8:27 AM  Other: Norberto Sorenson 07/25/2017  8:27 AM  Other:  07/25/2017  8:27 AM    Scribe for Treatment Team:  Roque Lias LCSW 07/25/2017 8:27 AM

## 2017-07-25 NOTE — Plan of Care (Signed)
  Activity: Sleeping patterns will improve 07/25/2017 2028 - Progressing by Delos HaringPhillips, Michael A, RN Note Pt slept 6.5 hrs last night   Education: Will be free of psychotic symptoms 07/25/2017 2028 - Progressing by Delos HaringPhillips, Michael A, RN Note Pt denies AVH- at this time

## 2017-07-25 NOTE — Progress Notes (Signed)
DAR NOTE: Patient presents with flat affect and depressed mood.  Denies suicidal thoughts, auditory and visual hallucinations.  Reports good night sleep.  Described energy level as normal and concentration as good.  Rates depression at 0, hopelessness at 0, and anxiety at 3.  Maintained on routine safety checks.  Medications given as prescribed.  Support and encouragement offered as needed.  Attended group and participated.  States goal for today is "resting."  Patient visible in milieu with minimal interaction.  Offered no complaint.

## 2017-07-25 NOTE — Progress Notes (Signed)
[facility]  07/25/2017 10:30 AM Margaret Farley  MRN:  [id number] Subjective:    Margaret Farley is a 27 y/o F with history of schizophrenia who was admitted on IVC placed by her parents due to worsening symptoms of psychosis including auditory hallucinations commanding her to kill herself and worsening personal hygiene. Pt requested to be started on risperdal. She also had been taking xanax and temazepam at home to address anxiety and insomnia, and she was in agreement to discontinue those medications with change to ativan 1mg  q6h prn severe anxiety. She was also started on trial of gabapentin and atarax PRN for mild anxiety. She was continued on home medications of zoloft, lexapro, and trazodone.   Today upon evaluation, pt shares, "I'm doing good today. I slept great last night." Pt shares that her mood and anxiety have improved today. She also reports AH have improved. When she first was admitted she rates intensity of AH as "7/10" and currently she reports AH intensity as "1/10." She denies SI/HI/VH. She has not utilized PRN of ativan or atarax since changes were made to regimen yesterday. She denies any side effects or physical complaints. We discussed increasing her dose of risperdal in the evening and pt was in agreement. She agrees to continue her other medications without changes. Pt was in agreement to sign ROI for her parents, so that treatment team can begin discharge planning. Pt was in agreement with the above plan, and she had no further questions, comments, or concerns.   Principal Problem: Schizophrenia, chronic with acute exacerbation (HCC) Diagnosis:   Patient Active Problem List   Diagnosis Date Noted  . Schizophrenia, chronic condition with acute exacerbation (HCC) [F20.9] 07/23/2017  . Schizophrenia, chronic with acute exacerbation (HCC) [F20.9] 07/23/2017   Total Time spent with patient: 30 minutes  Past Psychiatric History: see H&P  Past Medical History:  Past Medical  History:  Diagnosis Date  . ADD (attention deficit disorder)   . Anxiety   . Depression   . Schizophrenia (HCC)    History reviewed. No pertinent surgical history. Family History:  Family History  Problem Relation Age of Onset  . Heart disease Father        MI age unknown  . Cancer Maternal Grandmother        breast   Family Psychiatric  History: see H&P Social History:  Social History   Substance and Sexual Activity  Alcohol Use No     Social History   Substance and Sexual Activity  Drug Use No    Social History   Socioeconomic History  . Marital status: Single    Spouse name: None  . Number of children: None  . Years of education: None  . Highest education level: None  Social Needs  . Financial resource strain: None  . Food insecurity - worry: None  . Food insecurity - inability: None  . Transportation needs - medical: None  . Transportation needs - non-medical: None  Occupational History  . None  Tobacco Use  . Smoking status: Current Some Day Smoker    Packs/day: 0.00    Types: Cigarettes  . Smokeless tobacco: Never Used  Substance and Sexual Activity  . Alcohol use: No  . Drug use: No  . Sexual activity: No  Other Topics Concern  . 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 acetaminophen (TYLENOL) tablet 650 mg  650 mg Oral Q6H PRN Laveda Abbe, NP   650 mg at 07/24/17 1548  . alum & mag hydroxide-simeth (MAALOX/MYLANTA) 200-200-20 MG/5ML suspension 30 mL  30 mL Oral Q4H PRN Laveda Abbe, NP      . escitalopram (LEXAPRO) tablet  10 mg  10 mg Oral Daily Micheal Likens, MD   10 mg at 07/25/17 0739  . gabapentin (NEURONTIN) capsule 300 mg  300 mg Oral TID Jolyne Loa T, MD   300 mg at 07/25/17 0739  . hydrOXYzine (ATARAX/VISTARIL) tablet 50 mg  50 mg Oral Q6H PRN Micheal Likens, MD      . ibuprofen (ADVIL,MOTRIN) tablet 600 mg  600 mg Oral Q6H PRN Donell Sievert E, PA-C   600 mg at 07/25/17 0053  . LORazepam (ATIVAN) tablet 1 mg  1 mg Oral Q6H PRN Micheal Likens, MD      . magnesium hydroxide (MILK OF MAGNESIA) suspension 30 mL  30 mL Oral Daily PRN Laveda Abbe, NP      . risperiDONE (RISPERDAL M-TABS) disintegrating tablet 2 mg  2 mg Oral Q8H PRN Micheal Likens, MD       Or  . ziprasidone (GEODON) injection 20 mg  20 mg Intramuscular Q12H PRN Micheal Likens, MD      . risperiDONE (RISPERDAL) tablet 1 mg  1 mg Oral BID Micheal Likens, MD   1 mg at 07/25/17 0739  . sertraline (ZOLOFT) tablet 200 mg  200 mg Oral Daily Laveda Abbe, NP   200 mg at 07/25/17 0739  . traZODone (DESYREL) tablet 300 mg  300 mg Oral QHS Laveda Abbe, NP   300 mg at 07/25/17 6962    Lab Results:  Results for orders placed or performed during the hospital encounter of 07/23/17 (from the past 48 hour(s))  TSH     Status: None   Collection Time: 07/25/17  7:29 AM  Result Value Ref Range   TSH 1.608 0.350 - 4.500 uIU/mL    Comment: Performed by a 3rd Generation assay with a functional sensitivity of <=0.01 uIU/mL. Performed at [facility], [address], [place], [place] [address]   Hemoglobin A1c     Status: Abnormal   Collection Time: 07/25/17  7:29 AM  Result Value Ref Range   Hgb A1c MFr Bld 4.5 (L) 4.8 - 5.6 %    Comment: (NOTE) Pre diabetes:          5.7%-6.4% Diabetes:              >6.4% Glycemic control for   <7.0% adults with diabetes    Mean Plasma Glucose 82.45 mg/dL    Comment: Performed at [facility], [address], [place], [place] [address]  Lipid panel     Status: Abnormal   Collection Time: 07/25/17  7:29 AM  Result Value Ref Range   Cholesterol 218 (H) 0 - 200 mg/dL   Triglycerides 010 (H) <150  mg/dL   HDL 51 >21 mg/dL   Total CHOL/HDL Ratio 4.3 RATIO   VLDL 38 0 - 40 mg/dL   LDL Cholesterol 308 (H) 0 - 99 mg/dL    Comment:        Total Cholesterol/HDL:CHD Risk Coronary Heart Disease Risk Table                     Men   Women  1/2 Average Risk   3.4   3.3  Average Risk       5.0   4.4  2 X Average Risk   9.6   7.1  3 X Average Risk  23.4   11.0        Use the calculated Patient Ratio above and the CHD Risk Table to determine the patient's CHD Risk.        ATP III CLASSIFICATION (LDL):  <100     mg/dL   Optimal  657-846  mg/dL   Near or Above                    Optimal  130-159  mg/dL   Borderline  962-952  mg/dL   High  >841     mg/dL   Very High Performed at [facility], [address], [place], [place] [address]     Blood Alcohol level:  Lab Results  Component Value Date   [facility] <10 07/22/2017   ETH <5 11/07/2016    Metabolic Disorder Labs: Lab Results  Component Value Date   HGBA1C 4.5 (L) 07/25/2017   MPG 82.45 07/25/2017   No results found for: PROLACTIN Lab Results  Component Value Date   CHOL 218 (H) 07/25/2017   TRIG 192 (H) 07/25/2017   HDL 51 07/25/2017   CHOLHDL 4.3 07/25/2017   VLDL 38 07/25/2017   LDLCALC 129 (H) 07/25/2017    Physical Findings: AIMS: Facial and Oral Movements Muscles of Facial Expression: None, normal Lips and Perioral Area: None, normal Jaw: None, normal Tongue: None, normal,Extremity Movements Upper (arms, wrists, hands, fingers): None, normal Lower (legs, knees, ankles, toes): None, normal, Trunk Movements Neck, shoulders, hips: None, normal, Overall Severity Severity of abnormal movements (highest score from questions above): None, normal Incapacitation due to abnormal movements: None,  normal Patient's awareness of abnormal movements (rate only patient's report): No Awareness, Dental Status Current problems with teeth and/or dentures?: No Does patient usually wear dentures?: No  CIWA:    COWS:     Musculoskeletal: Strength & Muscle Tone: within normal limits Gait & Station: normal Patient leans: N/A  Psychiatric Specialty Exam: Physical Exam  Nursing note and vitals reviewed.   Review of Systems  Constitutional: Negative for chills and fever.  Respiratory: Negative for cough and shortness of breath.   Cardiovascular: Negative for chest pain.  Gastrointestinal: Negative for abdominal pain, heartburn, nausea and vomiting.  Psychiatric/Behavioral: Negative for depression, hallucinations and suicidal ideas. The patient is not nervous/anxious.     Blood pressure 103/71, pulse (!) 120, temperature 98.2 F (36.8 C), temperature source Oral, resp. rate 18, height 5\' 2"  (1.575 m), weight 59.9 kg (132 lb), last menstrual period 07/08/2017.Body mass index is 24.14 kg/m.  General Appearance: Casual and Fairly Groomed  Eye Contact:  Good  Speech:  Clear and Coherent and Normal Rate  Volume:  Normal  Mood:  Euthymic  Affect:  Flat  Thought Process:  Coherent and Goal Directed  Orientation:  Full (Time, Place, and Person)  Thought Content:  Hallucinations: Auditory  Suicidal Thoughts:  No  Homicidal Thoughts:  No  Memory:  Immediate;   Fair Recent;   Fair Remote;   Fair  Judgement:  Fair  Insight:  Fair  Psychomotor Activity:  Normal  Concentration:  Concentration: Fair  Recall:  [employer] of Knowledge:  Fair  Language:  Fair  Akathisia:  No  Handed:    AIMS (if indicated):     Assets:  Manufacturing systems engineer Physical Health Resilience  ADL's:  Intact  Cognition:  WNL  Sleep:  Number of Hours: 6.5    Treatment Plan Summary: Daily contact with patient to assess and evaluate symptoms and progress in treatment and Medication management  - Continue  inpatient hospitalization  - Schizophrenia   -Change risperdal 1mg  po BID to risperdal 1mg  po qAM + 2mg  po qhs   - Continue zoloft 200mg  po qDay   - Continue lexapro 10mg  po qDay  - Anxiety   - Continue gabapentin 300mg  po BID   - Continue atarax 50mg  po q6h prn mild anxiety   - Continue Ativan 1mg  po q6h prn severe anxiety  -Insomnia   - Continue trazodone 300mg  po qhs  -Encourage participation in groups and therapeutic milieu  -Discharge planning will be ongoing   Micheal Likens, MD 07/25/2017, 10:30 AM

## 2017-07-25 NOTE — [facility] Group Notes (Signed)
LCSW Group Therapy Note  07/25/2017 1:15pm  Type of Therapy/Topic:  Group Therapy:  Balance in Life  Participation Level:  Minimal  Description of Group:    This group will address the concept of balance and how it feels and looks when one is unbalanced. Patients will be encouraged to process areas in their lives that are out of balance and identify reasons for remaining unbalanced. Facilitators will guide patients in utilizing problem-solving interventions to address and correct the stressor making their life unbalanced. Understanding and applying boundaries will be explored and addressed for obtaining and maintaining a balanced life. Patients will be encouraged to explore ways to assertively make their unbalanced needs known to significant others in their lives, using other group members and facilitator for support and feedback.  Therapeutic Goals: 1. Patient will identify two or more emotions or situations they have that consume much of in their lives. 2. Patient will identify signs/triggers that life has become out of balance:  3. Patient will identify two ways to set boundaries in order to achieve balance in their lives:  4. Patient will demonstrate ability to communicate their needs through discussion and/or role plays  Summary of Patient Progress:   Stayed the entire time, appeared to be engaged throughout.  Stated that she feels less anxious today, but unable to verbalize how she knows.  Stated that her shaking leg is not a good indication "because I shake it all the time."  Stated she has a current therapist that is helpful, though yesterday she stated the only service she was getting from [facility] was medication management.   Therapeutic Modalities:   Cognitive Behavioral Therapy Solution-Focused Therapy Assertiveness Training  Ida RogueRodney B [place], [place] 07/25/2017 3:22 PM

## 2017-07-25 NOTE — Progress Notes (Signed)
Adult Psychoeducational Group Note  Date:  07/25/2017 Time:  9:17 PM  Group Topic/Focus:  Wrap-Up Group:   The focus of this group is to help patients review their daily goal of treatment and discuss progress on daily workbooks.  Participation Level:  Did Not Attend  Participation Quality:  Did not attend  Affect:  Did not attend  Cognitive:  Did not attend  Insight: None  Engagement in Group:  Did not attend  Modes of Intervention:  Did not attend  Additional Comments:  Did not attend  Lyndee HensenGoins, Latisha R 07/25/2017, 9:17 PM

## 2017-07-26 LAB — PROLACTIN: Prolactin: <0.1 ng/mL — ABNORMAL LOW (ref 4.8–23.3)

## 2017-07-26 NOTE — [facility] Group Notes (Signed)
[facility] Group Notes:  (Nursing/MHT/Case Management/Adjunct)  Date:  07/26/2017  Time:  6:33 PM  Type of Therapy:  Psychoeducational Skills  Participation Level:  Did Not Attend  Participation Quality:  DID NOT ATTEND  Affect:  DID NOT ATTEND  Cognitive:  DID NOT ATTEND  Insight:  None  Engagement in Group:  DID NOT ATTEND  Modes of Intervention:  DID NOT ATTEND  Summary of Progress/Problems: Pt did not attend patient self inventory group.   Bethann PunchesJane O Ochieng 07/26/2017, 6:33 PM

## 2017-07-26 NOTE — Progress Notes (Signed)
DAR NOTE: Pt present with flat affect and depressed mood in the unit. Pt has been isolating herself and has been bed most of the time. Pt denies physical pain, took all her meds as scheduled. As per self inventory, pt had a good night sleep, good appetite, normal energy, and good concentration. Pt rate depression at 0, hopeless ness at 0, and anxiety at 0. Pt's safety ensured with 15 minute and environmental checks. Pt currently denies SI/HI and A/V hallucinations. Pt verbally agrees to seek staff if SI/HI or A/VH occurs and to consult with staff before acting on these thoughts. Will continue POC.

## 2017-07-26 NOTE — [facility] Group Notes (Signed)
[facility] [employer] Group Therapy  07/26/2017 , 1:03 PM    Type of Therapy:  [employer] Presentation  Participation Level:  Active  Participation Quality:  Attentive  Affect:  Blunted  Cognitive:  Oriented  Insight:  Limited  Engagement in Therapy:  Engaged  Modes of Intervention:  Discussion, Education and Socialization  Summary of Progress/Problems:  Tammi  from [employer] came to present her recovery story, encourage group  members to share something about their story, and present information about the [employer].  Stayed the entire time, engaged throughout.  North, Rodney B 07/26/2017 , 1:03 PM   

## 2017-07-26 NOTE — Progress Notes (Signed)
Adult Psychoeducational Group Note  Date:  07/26/2017 Time:  10:24 PM  Group Topic/Focus:  Wrap-Up Group:   The focus of this group is to help patients review their daily goal of treatment and discuss progress on daily workbooks.  Participation Level:  Active  Participation Quality:  Appropriate  Affect:  Appropriate  Cognitive:  Appropriate  Insight: Appropriate  Engagement in Group:  Engaged  Modes of Intervention:  Discussion  Additional Comments:  Patient attended wrap-up group and participated.   Delicia W Forkpah 07/26/2017, 10:24 PM

## 2017-07-26 NOTE — Progress Notes (Addendum)
[facility] MD Progress Note  07/26/2017 8:56 AM Margaret Farley  MRN:  [id number] Subjective:    Margaret Farley is a 27 y/o F with history of schizophrenia who was admitted on IVC placed by her parents due to worsening symptoms of psychosis including auditory hallucinations commanding her to kill herself and worsening personal hygiene. Pt requested to be started on risperdal. She also had been taking xanax and temazepam at home to address anxiety and insomnia, and she was in agreement to discontinue those medications with change to ativan 1mg  q6h prn severe anxiety. She was also started on trial of gabapentin and atarax PRN for mild anxiety. She was continued on home medications of zoloft, lexapro, and trazodone.   Today upon evaluation, pt shares, "My anxiety continues to decrease with new medication and it is helping me to sleep better. I actually like the new medication." Pt shares that her mood and anxiety have improved today. Today she denies AH and does not appear to be preoccupied or responding to internal stimuli. Today she is noted to have much improvement in insight and concentration as she is able to discuss a plan for discharge and how to maintain concordance with her treatment plan. SHe states she plans to sleep better,eat better, exercise, deep breathing exercises, and take med. " Ive never had a problem taking my medications. Im always compliant."  She denies SI/HI/VH. She has not utilized PRN of ativan or atarax since changes were made to regimen yesterday. She denies any side effects or physical complaints. She was able to tolerate her increase in her Risperdal at this time. She agrees to continue her other medications without changes. Pt was in agreement to sign ROI for her parents, so that treatment team can begin discharge planning. Pt was in agreement with the above plan, and she had no further questions, comments, or concerns.   Principal Problem: Schizophrenia, chronic with acute exacerbation  (HCC) Diagnosis:   Patient Active Problem List   Diagnosis Date Noted  . Schizophrenia, chronic condition with acute exacerbation (HCC) [F20.9] 07/23/2017  . Schizophrenia, chronic with acute exacerbation (HCC) [F20.9] 07/23/2017   Total Time spent with patient: 30 minutes  Past Psychiatric History: see H&P  Past Medical History:  Past Medical History:  Diagnosis Date  . ADD (attention deficit disorder)   . Anxiety   . Depression   . Schizophrenia (HCC)    History reviewed. No pertinent surgical history. Family History:  Family History  Problem Relation Age of Onset  . Heart disease Father        MI age unknown  . Cancer Maternal Grandmother        breast   Family Psychiatric  History: see H&P Social History:  Social History   Substance and Sexual Activity  Alcohol Use No     Social History   Substance and Sexual Activity  Drug Use No    Social History   Socioeconomic History  . Marital status: Single    Spouse name: None  . Number of children: None  . Years of education: None  . Highest education level: None  Social Needs  . Financial resource strain: None  . Food insecurity - worry: None  . Food insecurity - inability: None  . Transportation needs - medical: None  . Transportation needs - non-medical: None  Occupational History  . None  Tobacco Use  . Smoking status: Current Some Day Smoker    Packs/day: 0.00    Types: Cigarettes  .  Smokeless tobacco: Never Used  Substance and Sexual Activity  . Alcohol use: No  . Drug use: No  . Sexual activity: No  Other Topics Concern  . 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      Home Situation:       Spiritual Beliefs: Christian      Lifestyle:regular exercise, working on trying to gain weight            Additional Social History:                         Sleep: Good  Appetite:  Good  Current Medications: Current Facility-Administered  Medications  Medication Dose Route Frequency Provider Last Rate Last Dose  . acetaminophen (TYLENOL) tablet 650 mg  650 mg Oral Q6H PRN Laveda AbbeParks, Laurie Britton, NP   650 mg at 07/26/17 0456  . alum & mag hydroxide-simeth (MAALOX/MYLANTA) 200-200-20 MG/5ML suspension 30 mL  30 mL Oral Q4H PRN Laveda AbbeParks, Laurie Britton, NP   30 mL at 07/25/17 1305  . escitalopram (LEXAPRO) tablet 10 mg  10 mg Oral Daily Micheal Likensainville, Christopher T, MD   10 mg at 07/26/17 0803  . gabapentin (NEURONTIN) capsule 300 mg  300 mg Oral TID Micheal Likensainville, Christopher T, MD   300 mg at 07/26/17 0803  . hydrOXYzine (ATARAX/VISTARIL) tablet 50 mg  50 mg Oral Q6H PRN Micheal Likensainville, Christopher T, MD   50 mg at 07/25/17 2104  . ibuprofen (ADVIL,MOTRIN) tablet 600 mg  600 mg Oral Q6H PRN Kerry HoughSimon, Spencer E, PA-C   600 mg at 07/26/17 0456  . LORazepam (ATIVAN) tablet 1 mg  1 mg Oral Q6H PRN Micheal Likensainville, Christopher T, MD      . magnesium hydroxide (MILK OF MAGNESIA) suspension 30 mL  30 mL Oral Daily PRN Laveda AbbeParks, Laurie Britton, NP      . risperiDONE (RISPERDAL M-TABS) disintegrating tablet 2 mg  2 mg Oral Q8H PRN Micheal Likensainville, Christopher T, MD       Or  . ziprasidone (GEODON) injection 20 mg  20 mg Intramuscular Q12H PRN Micheal Likensainville, Christopher T, MD      . risperiDONE (RISPERDAL) tablet 1 mg  1 mg Oral Daily Micheal Likensainville, Christopher T, MD   1 mg at 07/26/17 [id number]   And  . risperiDONE (RISPERDAL) tablet 2 mg  2 mg Oral QHS Micheal Likensainville, Christopher T, MD   2 mg at 07/25/17 2104  . sertraline (ZOLOFT) tablet 200 mg  200 mg Oral Daily Laveda AbbeParks, Laurie Britton, NP   200 mg at 07/26/17 [id number]  . traZODone (DESYREL) tablet 300 mg  300 mg Oral QHS Laveda AbbeParks, Laurie Britton, NP   300 mg at 07/25/17 2104    Lab Results:  Results for orders placed or performed during the hospital encounter of 07/23/17 (from the past 48 hour(s))  Prolactin     Status: Abnormal   Collection Time: 07/25/17  7:29 AM  Result Value Ref Range   Prolactin <0.1 (L) 4.8 - 23.3 ng/mL    Comment:  (NOTE) Performed At: [facility] [address] [place], [place] [id number] Jolene SchimkeNagendra Sanjai MD YN:[phone] Performed at [facility], [address] [place], [place] [address]   TSH     Status: None   Collection Time: 07/25/17  7:29 AM  Result Value Ref Range   TSH 1.608 0.350 - 4.500 uIU/mL    Comment: Performed by a 3rd Generation assay with  a functional sensitivity of <=0.01 uIU/mL. Performed at [facility], [address] [place], [place] [address]   Hemoglobin A1c     Status: Abnormal   Collection Time: 07/25/17  7:29 AM  Result Value Ref Range   Hgb A1c MFr Bld 4.5 (L) 4.8 - 5.6 %    Comment: (NOTE) Pre diabetes:          5.7%-6.4% Diabetes:              >6.4% Glycemic control for   <7.0% adults with diabetes    Mean Plasma Glucose 82.45 mg/dL    Comment: Performed at [facility], [address], [place], [place] [address]  Lipid panel     Status: Abnormal   Collection Time: 07/25/17  7:29 AM  Result Value Ref Range   Cholesterol 218 (H) 0 - 200 mg/dL   Triglycerides 829 (H) <150 mg/dL   HDL 51 >56 mg/dL   Total CHOL/HDL Ratio 4.3 RATIO   VLDL 38 0 - 40 mg/dL   LDL Cholesterol 213 (H) 0 - 99 mg/dL    Comment:        Total Cholesterol/HDL:CHD Risk Coronary Heart Disease Risk Table                     Men   Women  1/2 Average Risk   3.4   3.3  Average Risk       5.0   4.4  2 X Average Risk   9.6   7.1  3 X Average Risk  23.4   11.0        Use the calculated Patient Ratio above and the CHD Risk Table to determine the patient's CHD Risk.        ATP III CLASSIFICATION (LDL):  <100     mg/dL   Optimal  086-578  mg/dL   Near or Above                    Optimal  130-159  mg/dL   Borderline  469-629  mg/dL   High  >528     mg/dL   Very High Performed at [facility], [address] [place], [place] [address]     Blood Alcohol level:  Lab Results  Component Value Date   [facility] <10  07/22/2017   ETH <5 11/07/2016    Metabolic Disorder Labs: Lab Results  Component Value Date   HGBA1C 4.5 (L) 07/25/2017   MPG 82.45 07/25/2017   Lab Results  Component Value Date   PROLACTIN <0.1 (L) 07/25/2017   Lab Results  Component Value Date   CHOL 218 (H) 07/25/2017   TRIG 192 (H) 07/25/2017   HDL 51 07/25/2017   CHOLHDL 4.3 07/25/2017   VLDL 38 07/25/2017   LDLCALC 129 (H) 07/25/2017    Physical Findings: AIMS: Facial and Oral Movements Muscles of Facial Expression: None, normal Lips and Perioral Area: None, normal Jaw: None, normal Tongue: None, normal,Extremity Movements Upper (arms, wrists, hands, fingers): None, normal Lower (legs, knees, ankles, toes): None, normal, Trunk Movements Neck, shoulders, hips: None, normal, Overall Severity Severity of abnormal movements (highest score from questions above): None, normal Incapacitation due to abnormal movements: None, normal Patient's awareness of abnormal movements (rate only patient's report): No Awareness, Dental Status Current problems with teeth and/or dentures?: No Does patient usually wear dentures?: No  CIWA:    COWS:     Musculoskeletal: Strength &  Muscle Tone: within normal limits Gait & Station: normal Patient leans: N/A  Psychiatric Specialty Exam: Physical Exam  Nursing note and vitals reviewed.   Review of Systems  Constitutional: Negative for chills and fever.  Respiratory: Negative for cough and shortness of breath.   Cardiovascular: Negative for chest pain.  Gastrointestinal: Negative for abdominal pain, heartburn, nausea and vomiting.  Psychiatric/Behavioral: Negative for depression, hallucinations and suicidal ideas. The patient is not nervous/anxious.     Blood pressure 103/71, pulse (!) 120, temperature 98.2 F (36.8 C), temperature source Oral, resp. rate 18, height 5\' 2"  (1.575 m), weight 59.9 kg (132 lb), last menstrual period 07/08/2017.Body mass index is 24.14 kg/m.  General  Appearance: Casual and Fairly Groomed  Eye Contact:  Good  Speech:  Clear and Coherent and Normal Rate  Volume:  Normal  Mood:  Euthymic  Affect:  Flat brightens upon approach  Thought Process:  Coherent and Goal Directed  Orientation:  Full (Time, Place, and Person)  Thought Content:  Logical  Suicidal Thoughts:  No  Homicidal Thoughts:  No  Memory:  Immediate;   Fair Recent;   Fair Remote;   Fair  Judgement:  Fair  Insight:  Fair  Psychomotor Activity:  Normal  Concentration:  Concentration: Fair  Recall:  [employer] of Knowledge:  Fair  Language:  Fair  Akathisia:  No  Handed:    AIMS (if indicated):     Assets:  Manufacturing systems engineer Physical Health Resilience  ADL's:  Intact  Cognition:  WNL  Sleep:  Number of Hours: 5.75    Treatment Plan Summary: Daily contact with patient to assess and evaluate symptoms and progress in treatment and Medication management  - Continue inpatient hospitalization  - Schizophrenia   -Continue risperdal 1mg  po qAM + 2mg  po qhs   - Continue zoloft 200mg  po qDay   - Continue lexapro 10mg  po qDay  - Anxiety   - Continue gabapentin 300mg  po BID   - Continue atarax 50mg  po q6h prn mild anxiety   - Continue Ativan 1mg  po q6h prn severe anxiety  -Insomnia   - Continue trazodone 300mg  po qhs  -Encourage participation in groups and therapeutic milieu  -Discharge planning will be ongoing. Tentative discharge date of 07/27/2017 or 07/28/2017   Truman Hayward, FNP 07/26/2017, 8:56 AM   Agree with NP Progress Note

## 2017-07-26 NOTE — Progress Notes (Signed)
Recreation Therapy Notes  Date: 07/26/17 Time: 1000 Location: 500 Hall Dayroom  Group Topic: Communication  Goal Area(s) Addresses:  Patient will effectively communicate with peers in group.  Patient will verbalize benefit of healthy communication. Patient will verbalize positive effect of healthy communication on post d/c goals.  Patient will identify communication techniques that made activity effective for group.   Behavioral Response: Engaged  Intervention: ArchitectGeometrical designs, pencils, blank paper, chairs  Activity: Back to Back Drawings.  LRT divided patients into pairs of two.  One person was designated the speaker while the other person was the listener.  The speaker was given Farley picture they were to describe to their partner.  Their partner was to then draw the picture as it was described to them.  The listener was not allowed to ask any questions.  Once done they would compare pictures.  The two would then switch roles and LRT would give them Farley new picture to draw.  Education: Communication, Discharge Planning  Education Outcome: Acknowledges understanding/In group clarification offered/Needs additional education.   Clinical Observations/Feedback:  Pt stated she could have been more specific with her instructions on where the shapes were located.  Pt also stated she gave the wrong shape at times and as the listener, it was hard not to ask questions.     Margaret RancherMarjette Farley, LRT/CTRS      Margaret AbedLindsay, Margaret Farley 07/26/2017 11:13 AM

## 2017-07-27 MED ORDER — SALINE SPRAY 0.65 % NA SOLN
2.0000 | NASAL | Status: DC | PRN
  Administered 2017-07-27 (×2): 2 via NASAL
  Filled 2017-07-27: qty 44

## 2017-07-27 MED ORDER — GABAPENTIN 300 MG PO CAPS: 300.0000 mg | ORAL_CAPSULE | Freq: Three times a day (TID) | ORAL | 0 refills | Status: DC

## 2017-07-27 MED ORDER — TRAZODONE HCL 300 MG PO TABS: 300.0000 mg | ORAL_TABLET | Freq: Every day | ORAL | 0 refills | Status: DC

## 2017-07-27 MED ORDER — SALINE SPRAY 0.65 % NA SOLN: 2.0000 | NASAL | 0 refills | Status: DC | PRN

## 2017-07-27 MED ORDER — SERTRALINE HCL 100 MG PO TABS: 200.0000 mg | ORAL_TABLET | Freq: Every day | ORAL | 0 refills | Status: DC

## 2017-07-27 MED ORDER — ESCITALOPRAM OXALATE 10 MG PO TABS: 10.0000 mg | ORAL_TABLET | Freq: Every day | ORAL | 0 refills | Status: DC

## 2017-07-27 MED ORDER — RISPERIDONE 1 MG PO TABS: ORAL_TABLET | ORAL | 0 refills | Status: DC

## 2017-07-27 MED ORDER — HYDROXYZINE HCL 50 MG PO TABS: 50.0000 mg | ORAL_TABLET | Freq: Four times a day (QID) | ORAL | 0 refills | Status: DC | PRN

## 2017-07-27 NOTE — Progress Notes (Signed)
Recreation Therapy Notes  Date: 07/27/17 Time: 1000 Location: 500 Hall  Group Topic: Teambuilding  Goal Area(s) Addresses:  Patient will effectively work with peers in group.  Patient will verbalize benefit of teamwork. Patient will verbalize positive effect of teamwork post d/c goals.  Patient will identify techniques that made activity effective for group.   Behavioral Response: Engaged  Intervention: Social workerubber Disc  Activity: [place] in [employer].  Each patient was given a rubber disc and the group as a whole was given one disc.  Patients were to use the discs to [facility] the group from the nurse's station to the end of the hall and back to the nurse's station.  Education: Teambuilding, Discharge Planning  Education Outcome: Acknowledges understanding/In group clarification offered/Needs additional education.   Clinical Observations/Feedback: Pt was quiet and attentive during processing but was very active during the activity.  Pt worked well with her peers.     Caroll RancherMarjette [name], LRT/CTRS         Lillia AbedLindsay, Marjette A 07/27/2017 11:03 AM

## 2017-07-27 NOTE — [facility] Suicide Risk Assessment (Signed)
[facility] Discharge Suicide Risk Assessment   Principal Problem: Schizophrenia, chronic with acute exacerbation [facility]) Discharge Diagnoses:  Patient Active Problem List   Diagnosis Date Noted  . Schizophrenia, chronic condition with acute exacerbation (HCC) [F20.9] 07/23/2017  . Schizophrenia, chronic with acute exacerbation (HCC) [F20.9] 07/23/2017    Total Time spent with patient: 30 minutes  Musculoskeletal: Strength & Muscle Tone: within normal limits Gait & Station: normal Patient leans: N/A  Psychiatric Specialty Exam: ROS no headache, no chest pain, no dyspnea, no vomiting, no rash, no fever   Blood pressure 122/87, pulse (!) 116, temperature 97.9 F (36.6 C), temperature source Oral, resp. rate (!) 111, height 5\' 2"  (1.575 m), weight 59.9 kg (132 lb), last menstrual period 07/08/2017.Body mass index is 24.14 kg/m.  General Appearance: improved grooming   Eye Contact::  Good  Speech:  Normal Rate409  Volume:  Normal  Mood:  describes mood as " good"  Affect:  Appropriate and smiles at times appropriately   Thought Process:  Linear and Descriptions of Associations: Intact  Orientation:  Full (Time, Place, and Person)  Thought Content:  denies current hallucinations, and states she has not heard voices x 2-3 days. Not internally preoccupied. no delusions expressed   Suicidal Thoughts:  No denies suicidal ideations, denies any self injurious ideations   Homicidal Thoughts:  No denies any homicidal or violent ideations   Memory:  recent and remote grossly intact   Judgement:  Other:  improved   Insight:  improved   Psychomotor Activity:  Normal  Concentration:  Good  Recall:  Good  Fund of Knowledge:Good  Language: Good  Akathisia:  Negative  Handed:  Left  AIMS (if indicated):     Assets:  Communication Skills Desire for Improvement Resilience Social Support  Sleep:  Number of Hours: 0  Cognition: WNL  ADL's:  Intact   Mental Status Per Nursing Assessment::   On  Admission:     Demographic Factors:  27 year old single female, no children, lives with parents, on disability  Loss Factors: States recent psychiatric medication was not working for her   Historical Factors: States she has been diagnosed with Schizophrenia . Reports history of prior psychiatric admissions for psychotic symptoms. Denies history of suicide attempts, no history of self cutting, denies history of violence.  Denies alcohol or drug abuse .  Risk Reduction Factors:   Sense of responsibility to family, Living with another person, especially a relative and Positive coping skills or problem solving skills  Continued Clinical Symptoms:  At this time patient presents alert, attentive , calm, mood is improved , and denies feeling depressed. Affect is appropriate and reactive, no thought disorder , no suicidal ideations, no homicidal ideations, no hallucinations, not internally preoccupied, no delusions expressed . Oriented x 3. With patient's express consent I spoke with her mother on the phone, who corroborates patient is much improved and is in agreement with discharge today. Patient tolerating Risperidone well , we reviewed side effects . Behavior on unit calm , polite on approach   Cognitive Features That Contribute To Risk:  No gross cognitive deficits noted upon discharge. Is alert , attentive, and oriented x 3    Suicide Risk:  Mild:  Suicidal ideation of limited frequency, intensity, duration, and specificity.  There are no identifiable plans, no associated intent, mild dysphoria and related symptoms, good self-control (both objective and subjective assessment), few other risk factors, and identifiable protective factors, including available and accessible social support.  Follow-up Information  [facility] Follow up on 08/01/2017.   Why:  Wednesday at 1:30 with Dr Lauretta ChesterBarahni Contact information: [address] [place] [address] [phone]            Plan Of Care/Follow-up recommendations:  Activity:  as tolerated  Diet:  regular  Tests:  NA Other:  See below  Patient expresses readiness for discharge and is leaving unit in good spirits Plans to follow up as above Plans to return home- mother will pick her up later today.  Craige CottaFernando A Cobos, MD 07/27/2017, 10:35 AM

## 2017-07-27 NOTE — Progress Notes (Signed)
  [facility] :  Will you be returning to the same living situation after discharge:  Yes,  home At discharge, do you have transportation home?: Yes,  family Do you have the ability to pay for your medications: Yes,  insurance  Release of information consent forms completed and in the chart;  Patient's signature needed at discharge.  Patient to Follow up at: Follow-up Information    [facility] Follow up on 08/01/2017.   Why:  Wednesday at 1:30 with Dr Lauretta ChesterBarahni Contact information: [address] [place] [id number] [phone]           Next level of care provider has access to [facility] Link:no  Safety Planning and Suicide Prevention discussed: Yes,  yes  Have you used any form of tobacco in the last 30 days? (Cigarettes, Smokeless Tobacco, Cigars, and/or Pipes): Yes  Has patient been referred to the Quitline?: Patient refused referral  Patient has been referred for addiction treatment: N/A  Ida RogueRodney B North, LCSW 07/27/2017, 8:36 AM

## 2017-07-27 NOTE — Plan of Care (Cosign Needed)
3.8.19 Patient attended and participated appropriately during Recreation Therapy group treatment successfully engaging in group with a calm and appropriate mood at least 2x  

## 2017-07-27 NOTE — Progress Notes (Signed)
   D: Pt informed the writer that her "mom visited today, and that it went well". When asked about A/V , pt stated she "hasn't since she's been in here". Pt doesn't engage the Clinical research associatewriter in lengthy conversation. Pt has no questions or concerns.    A:  Support and encouragement was offered. 15 min checks continued for safety.  R: Pt remains safe.

## 2017-07-27 NOTE — Discharge Summary (Addendum)
Physician Discharge Summary Note  Patient:  Margaret Farley is an 27 y.o., female  MRN:  [id number]  DOB:  26-Jun-1990  Patient phone:  [phone] (home)   Patient address:   [address] [place] [address],   Total Time spent with patient: Greater than 30 minutes  Date of Admission:  07/23/2017  Date of Discharge: 07-27-17  Reason for Admission: Worsening psychosis.  Principal Problem: Schizophrenia, chronic with acute exacerbation [facility])  Discharge Diagnoses: Patient Active Problem List   Diagnosis Date Noted  . Schizophrenia, chronic condition with acute exacerbation (HCC) [F20.9] 07/23/2017  . Schizophrenia, chronic with acute exacerbation (HCC) [F20.9] 07/23/2017   Past Psychiatric History: Schizophrenia, chronic.  Past Medical History:  Past Medical History:  Diagnosis Date  . ADD (attention deficit disorder)   . Anxiety   . Depression   . Schizophrenia (HCC)    History reviewed. No pertinent surgical history.  Family History:  Family History  Problem Relation Age of Onset  . Heart disease Father        MI age unknown  . Cancer Maternal Grandmother        breast   Family Psychiatric  History: See H&P  Social History:  Social History   Substance and Sexual Activity  Alcohol Use No     Social History   Substance and Sexual Activity  Drug Use No    Social History   Socioeconomic History  . Marital status: Single    Spouse name: None  . Number of children: None  . Years of education: None  . Highest education level: None  Social Needs  . Financial resource strain: None  . Food insecurity - worry: None  . Food insecurity - inability: None  . Transportation needs - medical: None  . Transportation needs - non-medical: None  Occupational History  . None  Tobacco Use  . Smoking status: Current Some Day Smoker    Packs/day: 0.00    Types: Cigarettes  . Smokeless tobacco: Never Used  Substance and Sexual Activity  . Alcohol  use: No  . Drug use: No  . Sexual activity: No  Other Topics Concern  . 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 Upon initial presentation, pt shares, "I'm schizophrenic and I only have auditory hallucinations. My current medications hasn't been keeping them at bay, and my anxiety has gotten really bad. It's been going on for about a week and a half. I can't sleep at night; I only sleep about 3 or 4 hours and most of that is during the day because my voices are keeping me up at night." Pt is not able to identify a specific trigger or stressor which she associates with her worsened AH. She describes her AH as "insulting" or "derogatory" but she reports they are not command in nature. She denies SI/HI/VH. She endorses some paranoia that someone was going to break into her home. She endorses depressed  mood, and decreased appetite but she denies other symptoms of depression. She denies symptoms of mania, OCD, and PTSD. She denies all illicit substance use.  After the above admission assessment, Margaret Farley was started on the medication regimen for her presenting symptoms. She received & was discharged on Lexapro 10 mg for depression, Gabapentin 300 mg for agitation, Hydroxyzine 25 mg prn for anxiety, Risperdal 1 mg for mood control, Sertraline 200 mg for depression Trazodone 300 mg for insomnia. She was enrolled & participated in the group counseling sessions being offered & held on this unit. She learned coping skills. She also received/discharged on  other medications for the other mild medical issues presented. She tolerated her treatment regimen without any adverse effects or reactions reported.   Margaret Farley is seen today by the attending psychiatrist for discharge. She says she has normal anxiety about going home. She is not overwhelmed by this. She is looking forward to working on her mental health issues. Not expressing any delusions today. No hallucinations. Feels in control of herself. No suicidal thoughts. Looking forward to getting back to  life. No thoughts of violence. Does not feel depressed. No evidence of mania.  The nursing staff reports that patient has been appropriate on the unit. Patient has been interacting well with peers. No behavioral issues. Patient has not voiced any suicidal thoughts. Patient has not been observed to be internally stimulated or preoccupied. Patient has been adherent with treatment recommendations. Patient has been tolerating her medications well. No reported adverse effects or reactions.   Patient was discussed at the treatment team meeting this morning. The team members feel that patient is back to her baseline level of function. Team agrees with plan to discharge patient today to continue mental health health care on an outpatient bais. [facility] in no apparent distress. Transportation per family.  Physical Findings: AIMS: Facial and Oral Movements Muscles of Facial Expression: None, normal Lips and Perioral Area: None, normal Jaw: None, normal Tongue: None, normal,Extremity Movements Upper (arms, wrists, hands, fingers): None, normal Lower (legs, knees, ankles, toes): None, normal, Trunk Movements Neck, shoulders, hips: None, normal, Overall Severity Severity of abnormal movements (highest score from questions above): None, normal Incapacitation due to abnormal movements: None, normal Patient's awareness of abnormal movements (rate only patient's report): No Awareness,  Dental Status Current problems with teeth and/or dentures?: No Does patient usually wear dentures?: No  CIWA:    COWS:     Musculoskeletal: Strength & Muscle Tone: within normal limits Gait & Station: normal Patient leans: N/A  Psychiatric Specialty Exam: Physical Exam  Constitutional: She appears well-developed.  HENT:  Head: Normocephalic.  Eyes: Pupils are equal, round, and reactive to light.  Neck: Normal range of motion.  Cardiovascular: Normal rate.  GI: Soft.  Genitourinary:  Genitourinary Comments: Deferred  Musculoskeletal: Normal range of motion.  Neurological: She is alert.  Skin: Skin is warm.    Review of Systems  Constitutional: Negative.   HENT: Negative.   Eyes: Negative.   Respiratory: Negative.   Cardiovascular: Negative.   Gastrointestinal: Negative.   Genitourinary: Negative.   Musculoskeletal: Negative.   Skin: Negative.   Neurological: Negative.   Endo/Heme/Allergies: Negative.   Psychiatric/Behavioral: Positive for depression (Stable) and hallucinations (Hx. Psychosis). Negative for memory loss and suicidal ideas. The patient has insomnia (Stable). The patient is not nervous/anxious.     Blood pressure 122/87, pulse (!) 116, temperature 97.9 F (36.6 C), temperature source Oral, resp. rate (!) 111, height  5\' 2"  (1.575 m), weight 59.9 kg (132 lb), last menstrual period 07/08/2017.Body mass index is 24.14 kg/m.  See Md's SRA   Have you used any form of tobacco in the last 30 days? (Cigarettes, Smokeless Tobacco, Cigars, and/or Pipes): Yes  Has this patient used any form of tobacco in the last 30 days? (Cigarettes, Smokeless Tobacco, Cigars, and/or Pipes): N/A  Blood Alcohol level:  Lab Results  Component Value Date   ETH <10 07/22/2017   ETH <5 11/07/2016   Metabolic Disorder Labs:  Lab Results  Component Value Date   HGBA1C 4.5 (L) 07/25/2017   MPG 82.45 07/25/2017   Lab Results  Component Value Date   PROLACTIN <0.1 (L) 07/25/2017    Lab Results  Component Value Date   CHOL 218 (H) 07/25/2017   TRIG 192 (H) 07/25/2017   HDL 51 07/25/2017   CHOLHDL 4.3 07/25/2017   VLDL 38 07/25/2017   LDLCALC 129 (H) 07/25/2017   See Psychiatric Specialty Exam and Suicide Risk Assessment completed by Attending Physician prior to discharge.  Discharge destination:  Home  Is patient on multiple antipsychotic therapies at discharge:  No   Has Patient had three or more failed trials of antipsychotic monotherapy by history:  No  Recommended Plan for Multiple Antipsychotic Therapies: NA  Allergies as of 07/27/2017      Reactions   Benadryl Allergy  [diphenhydramine] Other (See Comments)   Restless leg syndrome      Medication List    STOP taking these medications   ALPRAZolam 0.5 MG tablet Commonly known as:  XANAX   ALPRAZolam 1 MG tablet Commonly known as:  XANAX   NYQUIL PO   temazepam 15 MG capsule Commonly known as:  RESTORIL   tretinoin 0.025 % cream Commonly known as:  RETIN-A   zolpidem 10 MG tablet Commonly known as:  AMBIEN     TAKE these medications     Indication  escitalopram 10 MG tablet Commonly known as:  LEXAPRO Take 1 tablet (10 mg total) by mouth daily. For depression Start taking on:  07/28/2017 What changed:  additional instructions  Indication:  Major Depressive Disorder   gabapentin 300 MG capsule Commonly known as:  NEURONTIN Take 1 capsule (300 mg total) by mouth 3 (three) times daily. For agitation  Indication:  Agitation   hydrOXYzine 50 MG tablet Commonly known as:  ATARAX/VISTARIL Take 1 tablet (50 mg total) by mouth every 6 (six) hours as needed (mild anxiety/insomnia).  Indication:  Feeling Anxious   risperiDONE 1 MG tablet Commonly known as:  RISPERDAL Take 1 tablet (1 mg) by mouth in the morning & 2 tablets (2 mg) at bedtime: For mood control  Indication:  Mood control   sertraline 100 MG tablet Commonly known as:  ZOLOFT Take 2 tablets (200 mg total) by mouth  daily. For depression Start taking on:  07/28/2017 What changed:  additional instructions  Indication:  Major Depressive Disorder   sodium chloride 0.65 % Soln nasal spray Commonly known as:  OCEAN Place 2 sprays into both nostrils as needed for congestion.  Indication:  Nasal congestion   trazodone 300 MG tablet Commonly known as:  DESYREL Take 1 tablet (300 mg total) by mouth at bedtime. For sleep What changed:    medication strength  additional instructions  Indication:  Trouble Sleeping      Follow-up Information    [facility] Follow up on 08/01/2017.   Why:  Wednesday at 1:30 with Dr Deatra James  Contact information: [address] [place] [address] [phone]          Follow-up recommendations: Activity:  As tolerated Diet: As recommended by your primary care doctor. Keep all scheduled follow-up appointments as recommended.   Comments: Patient is instructed prior to discharge to: Take all medications as prescribed by his/her mental healthcare provider. Report any adverse effects and or reactions from the medicines to his/her outpatient provider promptly. Patient has been instructed & cautioned: To not engage in alcohol and or illegal drug use while on prescription medicines. In the event of worsening symptoms, patient is instructed to call the crisis hotline, 911 and or go to the nearest ED for appropriate evaluation and treatment of symptoms. To follow-up with his/her primary care provider for your other medical issues, concerns and or health care needs.   Signed: Armandina Stammer, NP, PMHNP, FNP-BC 07/27/2017, 9:47 AM   Patient seen, Suicide Assessment Completed.  Disposition Plan Reviewed

## 2017-07-27 NOTE — Progress Notes (Signed)
Recreation Therapy Notes  INPATIENT RECREATION TR PLAN  Patient Details Name: Margaret Farley MRN: [id number] DOB: 11-16-1990 Today's Date: 07/27/2017  Rec Therapy Plan Is patient appropriate for Therapeutic Recreation?: Yes Treatment times per week: At least three Estimated Length of Stay: 5-7 days  TR Treatment/Interventions: Group participation (Appropriate participation in Recreation Therapy group tx.)  Discharge Criteria Pt will be discharged from therapy if:: Discharged Treatment plan/goals/alternatives discussed and agreed upon by:: Patient/family  Discharge Summary Short term goals set: See Care Plan Short term goals met: Complete Progress toward goals comments: Groups attended Which groups?: Self-esteem, Wellness, Communication Therapeutic equipment acquired: None  Reason patient discharged from therapy: Discharge from hospital Pt/family agrees with progress & goals achieved: Yes Date patient discharged from therapy: 07/27/17  Ranell Patrick, Recreation Therapy Intern   Ranell Patrick 07/27/2017, 10:48 AM

## 2017-07-27 NOTE — Progress Notes (Signed)
Patient discharged to lobby. Patient was stable and appreciative at that time. All papers and prescriptions were given and valuables returned. Verbal understanding expressed. Denies SI/HI and A/VH. Patient given opportunity to express concerns and ask questions.  

## 2017-07-27 NOTE — Progress Notes (Signed)
Adult Psychoeducational Group Note  Date:  07/27/2017 Time:  9:56 AM  Group Topic/Focus:  Goals Group:   The focus of this group is to help patients establish daily goals to achieve during treatment and discuss how the patient can incorporate goal setting into their daily lives to aide in recovery.  Participation Level:  Minimal  Participation Quality:  Appropriate  Affect:  Appropriate  Cognitive:  Appropriate  Insight: Improving  Engagement in Group:  Limited  Modes of Intervention:  Discussion and Education  Additional Comments:  Pt attended group but did not do much participating.  TRINITY, JOEL E 07/27/2017, 9:56 AM

## 2017-07-30 DIAGNOSIS — R569 Unspecified convulsions: Secondary | ICD-10-CM | POA: Insufficient documentation

## 2017-11-15 DIAGNOSIS — K589 Irritable bowel syndrome without diarrhea: Secondary | ICD-10-CM | POA: Insufficient documentation

## 2018-06-22 ENCOUNTER — Encounter (HOSPITAL_COMMUNITY): Payer: Self-pay

## 2018-06-22 ENCOUNTER — Ambulatory Visit (INDEPENDENT_AMBULATORY_CARE_PROVIDER_SITE_OTHER): Payer: Medicare Other

## 2018-06-22 ENCOUNTER — Ambulatory Visit (HOSPITAL_COMMUNITY)
Admission: EM | Admit: 2018-06-22 | Discharge: 2018-06-22 | Disposition: A | Discharge status: Home / Self Care | Payer: Medicare Other | Attending: Family Medicine | Admitting: Family Medicine

## 2018-06-22 ENCOUNTER — Other Ambulatory Visit: Payer: Self-pay

## 2018-06-22 DIAGNOSIS — R69 Illness, unspecified: Secondary | ICD-10-CM

## 2018-06-22 DIAGNOSIS — R05 Cough: Secondary | ICD-10-CM | POA: Diagnosis not present

## 2018-06-22 DIAGNOSIS — R059 Cough, unspecified: Secondary | ICD-10-CM

## 2018-06-22 DIAGNOSIS — R509 Fever, unspecified: Secondary | ICD-10-CM | POA: Diagnosis not present

## 2018-06-22 DIAGNOSIS — J111 Influenza due to unidentified influenza virus with other respiratory manifestations: Secondary | ICD-10-CM

## 2018-06-22 IMAGING — DX DG CHEST 2V
2 series · 2 of 2 positions shown · non-contrast
Comparison: Chest x-ray dated [DATE].

CLINICAL DATA: Fever and cough.

EXAM:
CHEST - 2 VIEW

[chest pa]
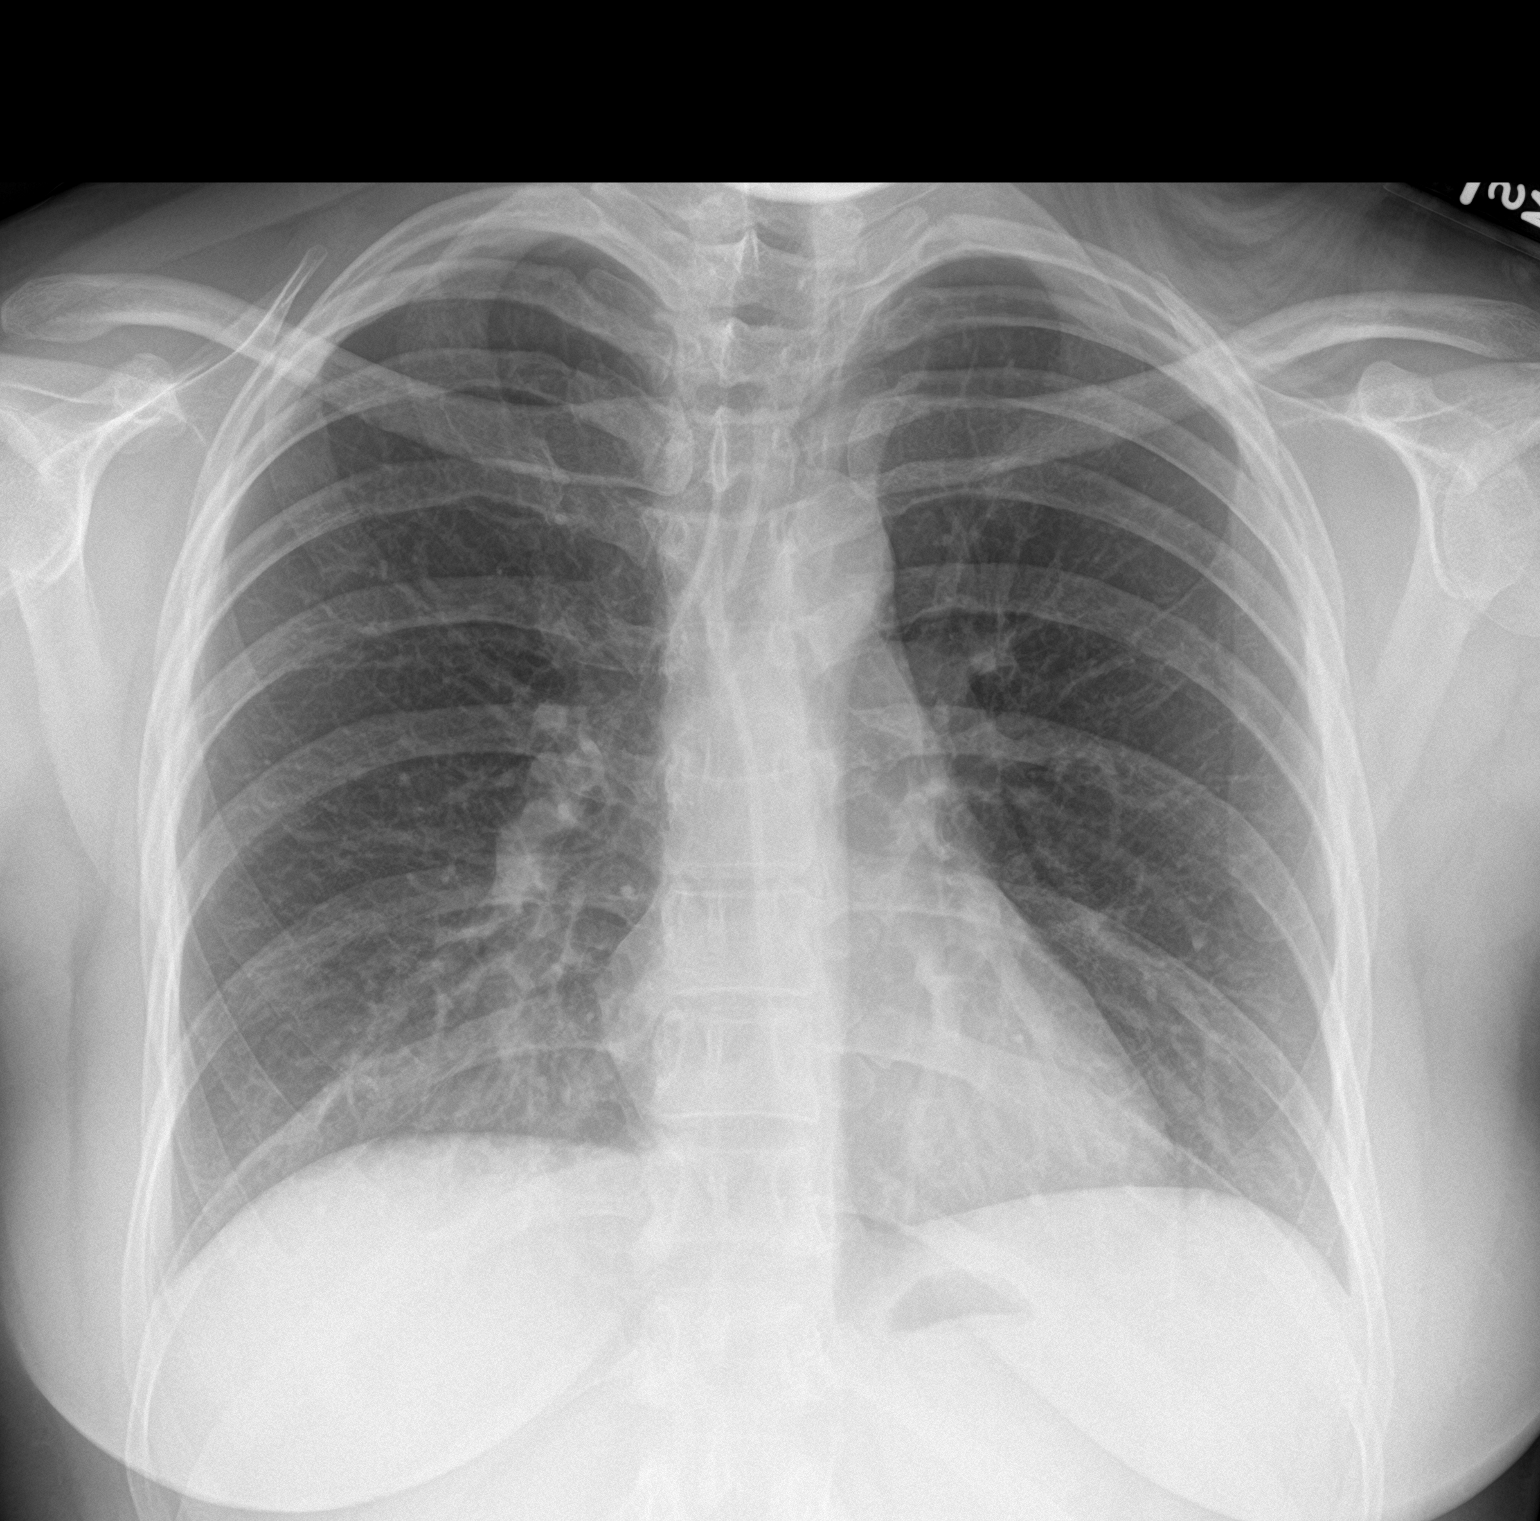

[chest lat]
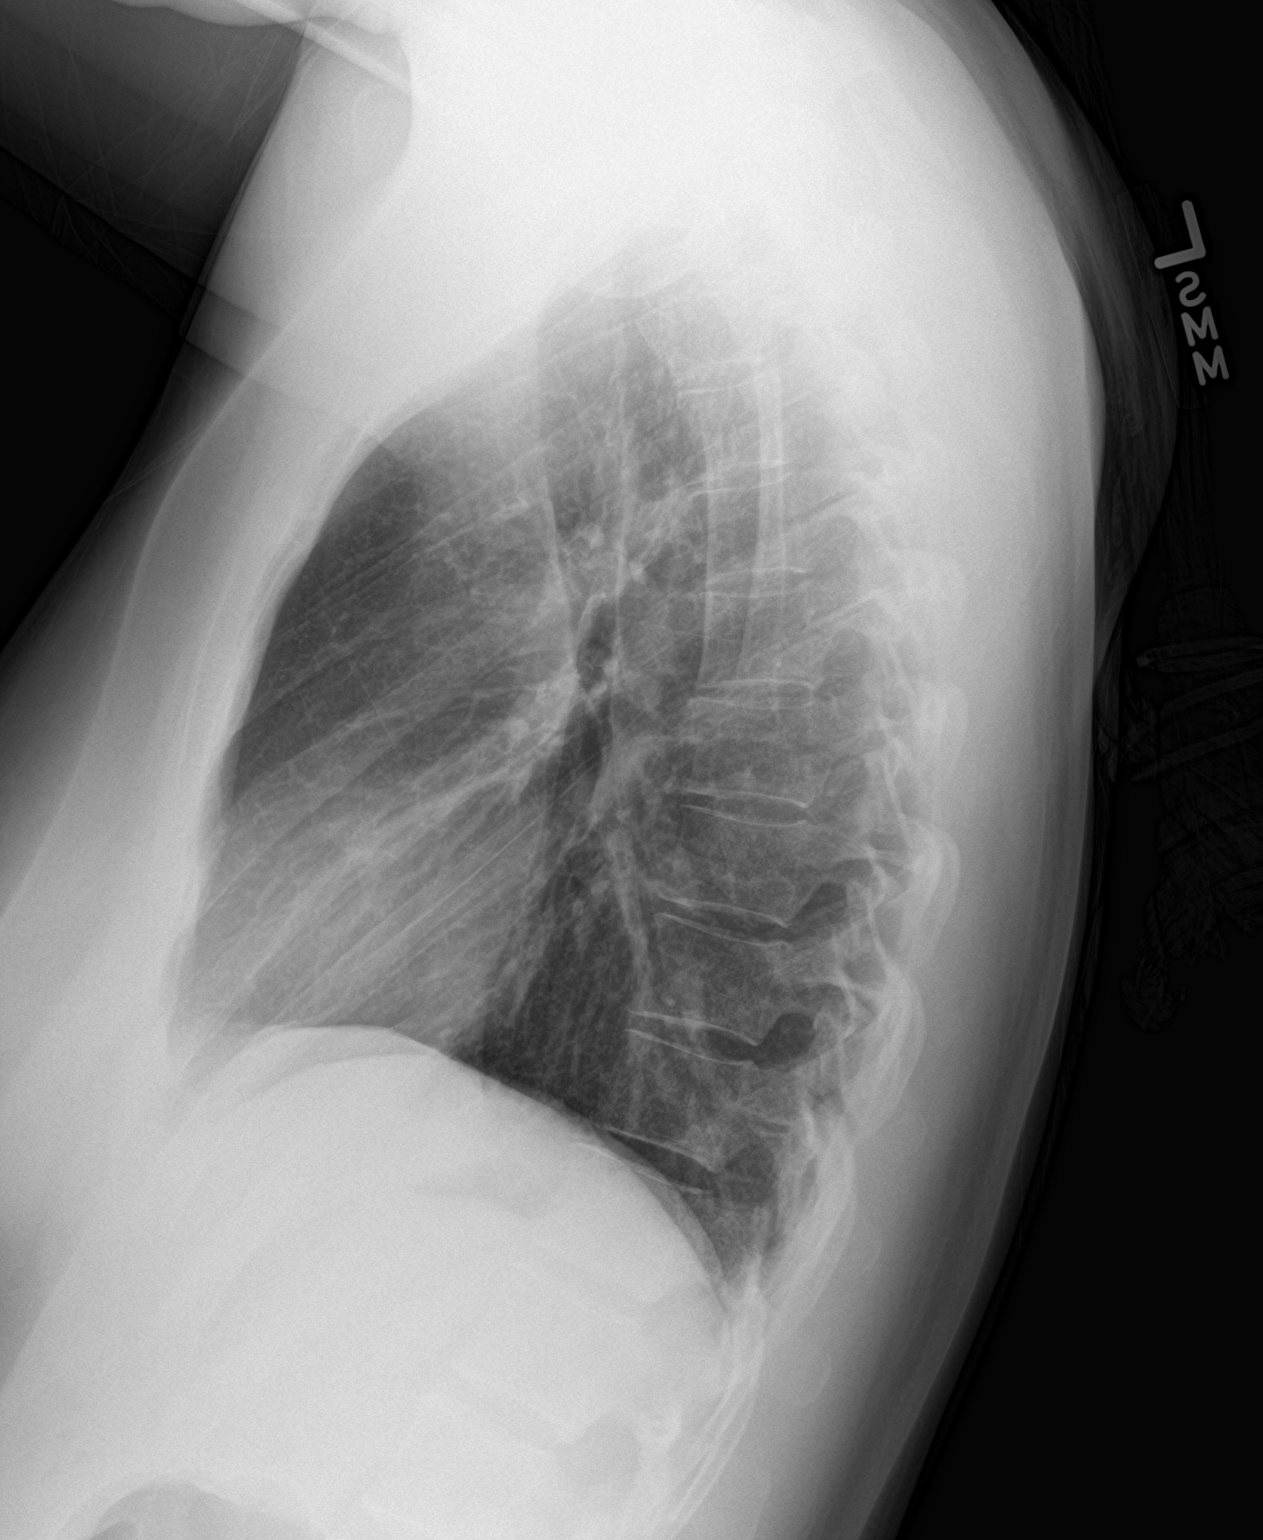

[2 of 2 positions shown; findings below may reference images not displayed]

FINDINGS: The heart size and mediastinal contours are within normal limits.
Normal pulmonary vascularity. No focal consolidation, pleural
effusion, or pneumothorax. Linear subsegmental atelectasis in the
right middle lobe. No acute osseous abnormality.
IMPRESSION: No active cardiopulmonary disease.

## 2018-06-22 MED ORDER — OSELTAMIVIR PHOSPHATE 75 MG PO CAPS: 75.0000 mg | ORAL_CAPSULE | Freq: Two times a day (BID) | ORAL | 0 refills | Status: AC

## 2018-06-22 MED ORDER — BENZONATATE 100 MG PO CAPS: ORAL_CAPSULE | ORAL | 0 refills | Status: DC

## 2018-06-22 NOTE — ED Provider Notes (Signed)
[facility]   [id number] 06/22/18 Arrival Time: 1001  ASSESSMENT & PLAN:  1. Cough   2. Fever, unspecified fever cause    I have personally viewed the imaging studies ordered this visit. No areas of consolidation noted. No infiltrates. Discussed.  See AVS for discharge instructions.  Meds ordered this encounter  Medications  . oseltamivir (TAMIFLU) 75 MG capsule    Sig: Take 1 capsule (75 mg total) by mouth 2 (two) times daily for 5 days.    Dispense:  10 capsule    Refill:  0  . benzonatate (TESSALON) 100 MG capsule    Sig: Take 1 capsule by mouth every 8 (eight) hours for cough.    Dispense:  21 capsule    Refill:  0   Discussed typical duration of symptoms. OTC symptom care as needed. Ensure adequate fluid intake and rest. May f/u with PCP or here as needed.  Reviewed expectations re: course of current medical issues. Questions answered. Outlined signs and symptoms indicating need for more acute intervention. Patient verbalized understanding. After Visit Summary given.   SUBJECTIVE: History from: patient and caregiver.  Margaret Farley is a 28 y.o. female who presents with complaint of nasal congestion, post-nasal drainage, and a persistent dry cough; without sore throat. Coughing (non-productive) started about two weeks ago. Worse over the past 1-2 days. Reports feeling fatigued. Mild body aches. SOB: none. Wheezing: none. Fever: yes, subjective yesterday; above 99 degrees F this morning per caregiver. Overall normal PO intake without n/v. Known sick contacts: no. No specific or significant aggravating or alleviating factors reported. OTC treatment: ibuprofen without much help.  Received flu shot this year: no.  Social History   Tobacco Use  Smoking Status Current Some Day Smoker  . Packs/day: 0.00  . Types: Cigarettes  Smokeless Tobacco Never Used   ROS: As per HPI. All other systems negative   OBJECTIVE:  Vitals:   06/22/18 1010 06/22/18 1012    BP:  108/70  Pulse:  (!) 127  Resp:  16  Temp:  (!) 97.3 F (36.3 C)  TempSrc:  Oral  SpO2:  97%  Weight: 63.5 kg      General appearance: alert; appears fatigued HEENT: nasal congestion; clear runny nose; throat irritation secondary to post-nasal drainage Neck: supple without LAD CV: tachycardia; regular rhythm Lungs: unlabored respirations, symmetrical air entry without wheezing; cough: moderate Abd: soft Ext: no LE edema Skin: warm and dry Psychological: alert and cooperative; normal mood and affect  Imaging: Dg Chest 2 View  Result Date: 06/22/2018 CLINICAL DATA:  Fever and cough. EXAM: CHEST - 2 VIEW COMPARISON:  Chest x-ray dated May 11, 2009. FINDINGS: The heart size and mediastinal contours are within normal limits. Normal pulmonary vascularity. No focal consolidation, pleural effusion, or pneumothorax. Linear subsegmental atelectasis in the right middle lobe. No acute osseous abnormality. IMPRESSION: No active cardiopulmonary disease. Electronically Signed   By: Obie DredgeWilliam T Derry M.D.   On: 06/22/2018 10:43    Allergies  Allergen Reactions  . Benadryl Allergy  [Diphenhydramine] Other (See Comments)    Restless leg syndrome    Past Medical History:  Diagnosis Date  . ADD (attention deficit disorder)   . Anxiety   . Depression   . Schizophrenia (HCC)    Family History  Problem Relation Age of Onset  . Heart disease Father        MI age unknown  . Cancer Maternal Grandmother        breast  Social History   Socioeconomic History  . Marital status: Single    Spouse name: Not on file  . Number of children: Not on file  . Years of education: Not on file  . Highest education level: Not on file  Occupational History  . Not on file  Social Needs  . Financial resource strain: Not on file  . Food insecurity:    Worry: Not on file    Inability: Not on file  . Transportation needs:    Medical: Not on file    Non-medical: Not on file  Tobacco Use  .  Smoking status: Current Some Day Smoker    Packs/day: 0.00    Types: Cigarettes  . Smokeless tobacco: Never Used  Substance and Sexual Activity  . Alcohol use: No  . Drug use: No  . Sexual activity: Never  Lifestyle  . Physical activity:    Days per week: Not on file    Minutes per session: Not on file  . Stress: Not on file  Relationships  . Social connections:    Talks on phone: Not on file    Gets together: Not on file    Attends religious service: Not on file    Active member of club or organization: Not on file    Attends meetings of clubs or organizations: Not on file    Relationship status: Not on file  . Intimate partner violence:    Fear of current or ex partner: Not on file    Emotionally abused: Not on file    Physically abused: Not on file    Forced sexual activity: Not on file  Other Topics Concern  . 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

## 2018-06-22 NOTE — Discharge Instructions (Signed)

## 2018-06-22 NOTE — ED Triage Notes (Signed)
Pt cc she has a bad cough , fever, and headaches. X 2 weeks. Pt has been using Advil for relief and it's not working.

## 2018-08-27 ENCOUNTER — Telehealth: Payer: Self-pay

## 2018-08-27 NOTE — Telephone Encounter (Signed)
Patient has been scheduled for a video visit with Dr. Anne Hahn tomorrow at 10:30 am.    Pt understands that although there may be some limitations with this type of visit, we will take all precautions to reduce any security or privacy concerns.  Pt understands that this will be treated like an in office visit and we will file with pt's insurance, and there may be a patient responsible charge related to this service.  Pt's email is [email or web address]. Pt understands that the [employer] webex software must be downloaded and operational on the device pt plans to use for the visit.  I reviewed pt's chart and updated according to changes pt relayed.

## 2018-08-28 ENCOUNTER — Ambulatory Visit: Payer: Medicare Other | Admitting: Neurology

## 2018-09-05 ENCOUNTER — Telehealth: Payer: Self-pay | Admitting: *Deleted

## 2018-09-05 ENCOUNTER — Ambulatory Visit (INDEPENDENT_AMBULATORY_CARE_PROVIDER_SITE_OTHER): Payer: Medicare Other | Admitting: Family Medicine

## 2018-09-05 ENCOUNTER — Encounter: Payer: Self-pay | Admitting: Family Medicine

## 2018-09-05 ENCOUNTER — Other Ambulatory Visit: Payer: Self-pay

## 2018-09-05 DIAGNOSIS — M5489 Other dorsalgia: Secondary | ICD-10-CM

## 2018-09-05 DIAGNOSIS — R1310 Dysphagia, unspecified: Secondary | ICD-10-CM

## 2018-09-05 DIAGNOSIS — G40909 Epilepsy, unspecified, not intractable, without status epilepticus: Secondary | ICD-10-CM

## 2018-09-05 DIAGNOSIS — F209 Schizophrenia, unspecified: Secondary | ICD-10-CM | POA: Diagnosis not present
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
## 2018-09-05 NOTE — Telephone Encounter (Signed)
Per office notes from 4/16, I attempted to call the to let her know low back exercises were mailed to her home address and could not leave a message due to voicemail being full.  Message forwarded to [facility] for insurance per Dr Selena Batten.

## 2018-09-05 NOTE — Patient Instructions (Signed)
Call your neurologist at [facility] regarding the Seizures and let us know if you need further assistance with this.  We placed a referral for you as discussed to the gastroenterologist about the swallowing issues and to the sports medicine doctor regarding the back pain. It usually takes about 1-2 weeks to process and schedule this referral. If you have not heard from [place] regarding this appointment in 2 weeks please contact our office.  My assistant well send you some exercise for back pain to try. Do these 4 days per week. For pain could try topical sports cream such as tiger balm.  You will need a family doctor and I encourage you to ensure you have a visit in 2-3 weeks to reassess.  I hope you are feeling better soon! Seek care promptly if your symptoms worsen, new concerns arise or you are not improving with treatment.

## 2018-09-06 ENCOUNTER — Other Ambulatory Visit: Payer: Self-pay

## 2018-09-06 NOTE — Telephone Encounter (Signed)
Spoke with pt and advised on insurance. Also discussed TOC with Koberlein, she will call back to schedule this at a later time. Nothing further needed.

## 2018-09-06 NOTE — Telephone Encounter (Signed)
Per RTE in registration and per [employer] website pt DOES have active Medicare A/B coverage. Appears Medicaid is secondary to this. We do accept this combination. She should be fine to continue care here. We have placed referrals for her, insurance information will be provided to these offices but we do not guarantee what insurances they accept.   LMTCB There is no DPR on file giving permission to speak with anyone other than pt. Mother answered phone and was advised we need to speak with pt directly, or she may give verbal for conversation with mother on a call by call basis.

## 2018-09-09 ENCOUNTER — Ambulatory Visit (INDEPENDENT_AMBULATORY_CARE_PROVIDER_SITE_OTHER): Payer: Medicare Other | Admitting: Gastroenterology

## 2018-09-09 ENCOUNTER — Other Ambulatory Visit: Payer: Self-pay

## 2018-09-09 ENCOUNTER — Encounter: Payer: Self-pay | Admitting: Gastroenterology

## 2018-09-09 VITALS — Ht 62.5 in | Wt 150.0 lb

## 2018-09-09 DIAGNOSIS — R1310 Dysphagia, unspecified: Secondary | ICD-10-CM

## 2018-09-09 MED ORDER — PANTOPRAZOLE SODIUM 40 MG PO TBEC: 40.0000 mg | DELAYED_RELEASE_TABLET | Freq: Every day | ORAL | 3 refills | Status: DC

## 2018-09-09 NOTE — Progress Notes (Signed)
TELEHEALTH VISIT  Referring Provider: Terressa KoyanagiKim, Hannah R, DO Primary Care Physician:  Terressa KoyanagiKim, Hannah R, DO   Tele-visit due to COVID-19 pandemic Patient requested visit virtually, consented to the virtual encounter via video enabled telemedicine application (Zoom) Contact made at: 09/09/18 14:26 Patient verified by name and date of birth Location of patient: Home Location provider: [place] [facility] Names of persons participating: Me, patient,  Her mother, Deneise LeverDesiree Brown CMA Time spent on telehealth visit:  I discussed the limitations of evaluation and management by telemedicine. The patient expressed understanding and agreed to proceed.  Reason for Consultation:  Choking on liquids   IMPRESSION:  Dysphagia to liquids x 8 weeks - primarily located in the posterior oropharynx/cervical region  Acute esophageal dysmotility is unlikely. ? GERD-reated dysmotility, although unusual for this to only be related to liquids.  Suspected oropharyngeal dysphagia. This is unlikely to be esophageal cancer.   PLAN: Barium esophagram Trial of pantoprazole 40 mg daily Abstinence of vaping recommended Follow-up appointment after esophagram Consider modified barium swallow if the esophagram is normal  HPI: Margaret Farley is a 28 y.o. female with anxiety, depression, schizophrenia, a seizure disorder [facility] with records showing a normal EEG, and back pain. Her father recently died. Referred by Dr. Selena BattenKim for dysphagia. History is obtained through the patient and review of her electronic health record.   Eight weeks of symptoms. Initially 1-2 times a week. Now occurring. Daily episode of choking.  Occurs with liquids. Feels like liquids are "going down the wrong pipe." Does not occur with solids. Localizes to the posterior oropharynx. Feels like she has to cough or choke.  No nasopharyngeal regurgitation.  No sensation of residual food remaining in the pharynx.  No globus. No  odynophagia, sore throat, nausea, abdomina pain, regurgitation. Associated coughing.  No drooling, food spillage, piecemeal swallows, or dysarthria.  No hematemesis or anemia.  Has not tried any medications, OTC medications, changed her diet. Good appetite. Gaining weight.   Recently on Doylene BodeJenny Craig but she doesn't think this related. She vapes.  CXR for fever and cough 06/22/18 was normal. No prior abdominal imaging. No prior endoscopic evaluation.    Her mother is concerns about esophageal cancer.   Mom, maternal aunts, and maternal grandmother have required esophageal dilation. No known family history of colon cancer or polyps. No family history of uterine/endometrial cancer, pancreatic cancer or gastric/stomach cancer.  Past Medical History:  Diagnosis Date  . ADD (attention deficit disorder)   . ADD (attention deficit disorder)   . Allergic rhinitis   . Anxiety   . Anxiety   . Depression   . Depression   . Schizophrenia (HCC)     No past surgical history on file.  Current Outpatient Medications  Medication Sig Dispense Refill  . haloperidol (HALDOL) 10 MG tablet Take 1 tablet by mouth 4 (four) times daily.    Marland Kitchen. LORazepam (ATIVAN) 0.5 MG tablet Take 1 tablet by mouth 3 (three) times daily.    . QUEtiapine (SEROQUEL) 100 MG tablet Take 400 mg by mouth daily.    . silver sulfADIAZINE (SILVADENE) 1 % cream Apply 1 application topically daily.    Marland Kitchen. tretinoin (RETIN-A) 0.025 % cream APPLY ON THE SKIN NIGHTLY    . vortioxetine HBr (TRINTELLIX) 20 MG TABS tablet Take 20 mg by mouth. 2 tablets once a day.    . zolpidem (AMBIEN) 10 MG tablet Take 1 tablet by mouth daily.     No current facility-administered  medications for this visit.     Allergies as of 09/09/2018 - Review Complete 09/09/2018  Allergen Reaction Noted  . Benadryl allergy  [diphenhydramine] Other (See Comments) 03/27/2017    Family History  Problem Relation Age of Onset  . Heart disease Father        MI age unknown   . Parkinson's disease Father   . Cancer Maternal Grandmother        breast  . High Cholesterol Mother   . High blood pressure Mother   . Depression Mother   . Anxiety disorder Mother     Social History   Socioeconomic History  . Marital status: Single    Spouse name: Not on file  . Number of children: Not on file  . Years of education: Not on file  . Highest education level: Some college, no degree  Occupational History  . Not on file  Social Needs  . Financial resource strain: Not on file  . Food insecurity:    Worry: Not on file    Inability: Not on file  . Transportation needs:    Medical: Not on file    Non-medical: Not on file  Tobacco Use  . Smoking status: Former Smoker    Packs/day: 0.00    Types: Cigarettes  . Smokeless tobacco: Never Used  Substance and Sexual Activity  . Alcohol use: No  . Drug use: No  . Sexual activity: Never  Lifestyle  . Physical activity:    Days per week: Not on file    Minutes per session: Not on file  . Stress: Not on file  Relationships  . Social connections:    Talks on phone: Not on file    Gets together: Not on file    Attends religious service: Not on file    Active member of club or organization: Not on file    Attends meetings of clubs or organizations: Not on file    Relationship status: Not on file  . Intimate partner violence:    Fear of current or ex partner: Not on file    Emotionally abused: Not on file    Physically abused: Not on file    Forced sexual activity: Not on file  Other Topics Concern  . 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  Physical Exam: General: in no acute distress Neuro:  Alert and appropriate Psych: Normal affect and normal insight    L. Orvan Falconer, MD, MPH [facility] 09/09/2018, 1:24 PM

## 2018-09-09 NOTE — Patient Instructions (Addendum)
I am recommending a barium esophagram. [facility] will call you with an appointment.   I recommend a trial of pantoprazole 40 mg daily in the event that this is related to acid reflux.   Follow-up appointment planned after the esophagram results.   Thank you for your patience with me and our technology today! Please stay home, safe, and healthy. I look forward to meeting you in person in the future.

## 2018-09-13 ENCOUNTER — Telehealth: Payer: Self-pay | Admitting: Family Medicine

## 2018-09-13 NOTE — Telephone Encounter (Signed)
I have scheduled patient for a V visit with Dr. Katrinka Blazing on Monday 4/27.  Please send text for doxy appt.   If Dr. Katrinka Blazing would prefer patient come in please follow up with in regard.

## 2018-09-15 NOTE — Progress Notes (Addendum)
Margaret Farley [address] [place], [place] [address] Phone: [phone] Subjective:   Virtual Visit via Video Note  I connected with Margaret Farley on 09/15/18 at  1:45 PM EDT by a video enabled telemedicine application and verified that I am speaking with the correct person using two identifiers.   I discussed the limitations of evaluation and management by telemedicine and the availability of in person appointments. The patient expressed understanding and agreed to proceed. Patient was in home setting.  I am in office setting     I discussed the assessment and treatment plan with the patient. The patient was provided an opportunity to ask questions and all were answered. The patient agreed with the plan and demonstrated an understanding of the instructions.   The patient was advised to call back or seek an in-person evaluation if the symptoms worsen or if the condition fails to improve as anticipated.  I provided 39 minutes of face-to-face time during this encounter.   Margaret Saa, DO   Was referred by primary care provider Margaret Koyanagi, DO  CC: Low back pain  MBT:[id number]  Margaret Farley is a 28 y.o. female coming in with complaint of low back pain, going on 2 months.  Patient was caring for her father who was on hospice.  Recently also had a seizure which had been well controlled before this.  Past medical history significant for schizophrenia and needing Haldol sometimes on a regular basis.  Patient describes the pain as a dull, throbbing aching sensation that seems to be in the scapular region and then go inferiorly.  Denies any association with food or any type of menstruation.  Denies any radiation down the legs.  States that the pain can be severe enough to stop her from activity.  Denies any fevers chills or any abnormal weight loss.  Rates the severity of pain is 9 out of 10.   Patient did have a chest x-ray in February 2020.  Independently  visualized by me showing no significant bony abnormality.  Past Medical History:  Diagnosis Date  . ADD (attention deficit disorder)   . ADD (attention deficit disorder)   . Allergic rhinitis   . Anxiety   . Anxiety   . Depression   . Depression   . Schizophrenia (HCC)    No past surgical history on file. Social History   Socioeconomic History  . Marital status: Single    Spouse name: Not on file  . Number of children: Not on file  . Years of education: Not on file  . Highest education level: Some college, no degree  Occupational History  . Not on file  Social Needs  . Financial resource strain: Not on file  . Food insecurity:    Worry: Not on file    Inability: Not on file  . Transportation needs:    Medical: Not on file    Non-medical: Not on file  Tobacco Use  . Smoking status: Former Smoker    Packs/day: 0.00    Types: Cigarettes  . Smokeless tobacco: Never Used  Substance and Sexual Activity  . Alcohol use: No  . Drug use: No  . Sexual activity: Never  Lifestyle  . Physical activity:    Days per week: Not on file    Minutes per session: Not on file  . Stress: Not on file  Relationships  . Social connections:    Talks on phone: Not on file  Gets together: Not on file    Attends religious service: Not on file    Active member of club or organization: Not on file    Attends meetings of clubs or organizations: Not on file    Relationship status: Not on file  Other Topics Concern  . 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 Benadryl Allergy  [Diphenhydramine] Other (See Comments)    Restless leg syndrome   Family History  Problem Relation Age of Onset  . Heart disease Father        MI  age unknown  . Parkinson's disease Father   . Cancer Maternal Grandmother        breast  . High Cholesterol Mother   . High blood pressure Mother   . Depression Mother   . Anxiety disorder Mother          Current Outpatient Medications (Other):  .  haloperidol (HALDOL) 10 MG tablet, Take 1 tablet by mouth 4 (four) times daily. Marland Kitchen.  LORazepam (ATIVAN) 0.5 MG tablet, Take 1 tablet by mouth 3 (three) times daily. .  pantoprazole (PROTONIX) 40 MG tablet, Take 1 tablet (40 mg total) by mouth daily. .  QUEtiapine (SEROQUEL) 100 MG tablet, Take 400 mg by mouth daily. .  silver sulfADIAZINE (SILVADENE) 1 % cream, Apply 1 application topically daily. Marland Kitchen.  tretinoin (RETIN-A) 0.025 % cream, APPLY ON THE SKIN NIGHTLY .  vortioxetine HBr (TRINTELLIX) 20 MG TABS tablet, Take 20 mg by mouth. 2 tablets once a day. .  zolpidem (AMBIEN) 10 MG tablet, Take 1 tablet by mouth daily.    Past medical history, social, surgical and family history all reviewed in electronic medical record.  No pertanent information unless stated regarding to the chief complaint.   Review of Systems:  No  visual changes, nausea, vomiting, diarrhea, constipation, dizziness, abdominal pain, skin rash, fevers, chills, night sweats, weight loss, swollen lymph nodes chest pain, shortness of breath, mood changes.  Positive muscle aches, body aches, headaches  Objective     General: No apparent distress alert and oriented x3 mood significant flattened affect HEENT: Pupils equal, extraocular movements intact  Respiratory: Patient's speak in full sentences and does not appear short of breath \ Impression and Recommendations:     This case required medical decision making of moderate complexity. The above documentation has been reviewed and is accurate and complete Margaret SaaZachary M Smith, DO       Note: This dictation was prepared with Dragon dictation along with smaller phrase technology. Any transcriptional errors that result from  this process are unintentional.

## 2018-09-16 ENCOUNTER — Encounter: Payer: Self-pay | Admitting: Family Medicine

## 2018-09-16 ENCOUNTER — Ambulatory Visit (INDEPENDENT_AMBULATORY_CARE_PROVIDER_SITE_OTHER): Payer: Medicare Other | Admitting: Family Medicine

## 2018-09-16 DIAGNOSIS — M545 Low back pain, unspecified: Secondary | ICD-10-CM | POA: Insufficient documentation

## 2018-09-16 DIAGNOSIS — G8929 Other chronic pain: Secondary | ICD-10-CM | POA: Diagnosis not present

## 2018-09-16 MED ORDER — GABAPENTIN 100 MG PO CAPS: 200.0000 mg | ORAL_CAPSULE | Freq: Every day | ORAL | 3 refills | Status: DC

## 2018-09-16 MED ORDER — MELOXICAM 15 MG PO TABS: 15.0000 mg | ORAL_TABLET | Freq: Every day | ORAL | 0 refills | Status: DC

## 2018-09-16 NOTE — Assessment & Plan Note (Addendum)
Low back pain.  Has been chronic for 2 months.  Not making improvement with over-the-counter medications.  X-rays previously taken that I can review were fairly unremarkable.  Did have a history of a seizure.  Patient has been on higher doses of Seroquel and other medications.  Patient does have Haldol as well that she can take.  I more concerned of the possibility of this being psychosomatic.  Discussed home exercises and sent some to her email.  Meloxicam and gabapentin given.  Will avoid muscle relaxer secondary to the potential for the seizure disorder.  Discussed posture ergonomics and how this could be beneficial.  Patient declined that she is sexually active so does not think she is has a chance of being pregnant even though she has not had a menstruation for over 2 months.  Patient did not want any testing at the moment.  Patient knows of worsening symptoms she needs to seek medical attention immediately.  Patient will follow-up with me again virtually in 1 week.  We did discuss that the differential includes gastroenterology which patient states and has been worked up previously.

## 2018-09-16 NOTE — Telephone Encounter (Signed)
Done

## 2018-09-22 NOTE — Progress Notes (Signed)
Margaret ScaleZach Smith D.O. [facility] [address] [place], [place] [address] Phone: [phone] Subjective:    Virtual Visit via Video Note  I connected with Margaret LericheMary K Farley on 09/23/18 at 12:45 PM EDT by a video enabled telemedicine application and verified that I am speaking with the correct person using two identifiers.  Location: Patient\patient and home setting Provider: I was in office setting   I discussed the limitations of evaluation and management by telemedicine and the availability of in person appointments. The patient expressed understanding and agreed to proceed.     I discussed the assessment and treatment plan with the patient. The patient was provided an opportunity to ask questions and all were answered. The patient agreed with the plan and demonstrated an understanding of the instructions.   The patient was advised to call back or seek an in-person evaluation if the symptoms worsen or if the condition fails to improve as anticipated.  I provided 31 minutes of non-face-to-face time during this encounter.   Judi SaaZachary M Smith, DO  :    CC: Low back pain follow-up  WGN:[id number]:Subjective  Margaret LericheMary K Farley is a 28 y.o. female coming in with complaint of low back pain.  Patient did a virtual visit with me 1 week ago.  Chronic back pain for 2 weeks.  X-rays have been unremarkable.  Patient has a past medical history significant for hypokalemia, seizures, and other mood disorder.  Patient was given home exercises discussed icing regimen and over-the-counter natural supplementations.  Patient states no improvement at the moment.  Has not been doing anything.  Is also not been doing the exercises regularly       Past Medical History:  Diagnosis Date  . ADD (attention deficit disorder)   . ADD (attention deficit disorder)   . Allergic rhinitis   . Anxiety   . Anxiety   . Depression   . Depression   . Schizophrenia (HCC)    No past surgical history on file. Social  History   Socioeconomic History  . Marital status: Single    Spouse name: Not on file  . Number of children: Not on file  . Years of education: Not on file  . Highest education level: Some college, no degree  Occupational History  . Not on file  Social Needs  . Financial resource strain: Not on file  . Food insecurity:    Worry: Not on file    Inability: Not on file  . Transportation needs:    Medical: Not on file    Non-medical: Not on file  Tobacco Use  . Smoking status: Former Smoker    Packs/day: 0.00    Types: Cigarettes  . Smokeless tobacco: Never Used  Substance and Sexual Activity  . Alcohol use: No  . Drug use: No  . Sexual activity: Never  Lifestyle  . Physical activity:    Days per week: Not on file    Minutes per session: Not on file  . Stress: Not on file  Relationships  . Social connections:    Talks on phone: Not on file    Gets together: Not on file    Attends religious service: Not on file    Active member of club or organization: Not on file    Attends meetings of clubs or organizations: Not on file    Relationship status: Not on file  Other Topics Concern  . 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 Benadryl Allergy  [Diphenhydramine] Other (See Comments)    Restless leg syndrome   Family History  Problem Relation Age of Onset  . Heart disease Father        MI age unknown  . Parkinson's disease Father   . Cancer Maternal Grandmother        breast  . High Cholesterol Mother   . High blood pressure Mother   . Depression Mother   . Anxiety disorder Mother        Current Outpatient Medications (Analgesics):  .  meloxicam (MOBIC) 15 MG tablet, Take 1 tablet (15 mg total) by  mouth daily.   Current Outpatient Medications (Other):  .  gabapentin (NEURONTIN) 100 MG capsule, Take 2 capsules (200 mg total) by mouth at bedtime. .  haloperidol (HALDOL) 10 MG tablet, Take 1 tablet by mouth 4 (four) times daily. Marland Kitchen  LORazepam (ATIVAN) 0.5 MG tablet, Take 1 tablet by mouth 3 (three) times daily. .  pantoprazole (PROTONIX) 40 MG tablet, Take 1 tablet (40 mg total) by mouth daily. .  QUEtiapine (SEROQUEL) 100 MG tablet, Take 400 mg by mouth daily. .  silver sulfADIAZINE (SILVADENE) 1 % cream, Apply 1 application topically daily. Marland Kitchen  tretinoin (RETIN-A) 0.025 % cream, APPLY ON THE SKIN NIGHTLY .  Vitamin D, Ergocalciferol, (DRISDOL) 1.25 MG (50000 UT) CAPS capsule, Take 1 capsule (50,000 Units total) by mouth every 7 (seven) days. Marland Kitchen  vortioxetine HBr (TRINTELLIX) 20 MG TABS tablet, Take 20 mg by mouth. 2 tablets once a day. .  zolpidem (AMBIEN) 10 MG tablet, Take 1 tablet by mouth daily.    Past medical history, social, surgical and family history all reviewed in electronic medical record.  No pertanent information unless stated regarding to the chief complaint.   Review of Systems:  No headache, visual changes, nausea, vomiting, diarrhea, constipation, dizziness, abdominal pain, skin rash, fevers, chills, night sweats, weight loss, swollen lymph nodes, body aches, joint swelling,  chest pain, shortness of breath, mood changes.  Positive muscle aches  Objective    General: No apparent distress alert and oriented x3 mood and affect depressed, dressed appropriately.  HEENT: Pupils equal, extraocular movements intact      Impression and Recommendations:     This case required medical decision making of moderate complexity. The above documentation has been reviewed and is accurate and complete Judi Saa, DO       Note: This dictation was prepared with Dragon dictation along with smaller phrase technology. Any transcriptional errors that result from this process  are unintentional.

## 2018-09-23 ENCOUNTER — Encounter: Payer: Self-pay | Admitting: Family Medicine

## 2018-09-23 ENCOUNTER — Ambulatory Visit (INDEPENDENT_AMBULATORY_CARE_PROVIDER_SITE_OTHER): Payer: Medicare Other | Admitting: Family Medicine

## 2018-09-23 DIAGNOSIS — M545 Low back pain, unspecified: Secondary | ICD-10-CM

## 2018-09-23 DIAGNOSIS — G8929 Other chronic pain: Secondary | ICD-10-CM | POA: Diagnosis not present

## 2018-09-23 DIAGNOSIS — G40909 Epilepsy, unspecified, not intractable, without status epilepticus: Secondary | ICD-10-CM

## 2018-09-23 MED ORDER — VITAMIN D (ERGOCALCIFEROL) 1.25 MG (50000 UNIT) PO CAPS: 50000.0000 [IU] | ORAL_CAPSULE | ORAL | 0 refills | Status: DC

## 2018-09-23 NOTE — Assessment & Plan Note (Signed)
Patient has been noncompliant.  Does have schizophrenia.  I do think that this is contributing.  Some psychosomatic is there.  We discussed increasing the gabapentin.  Discussed vitamin D once weekly.  We discussed meloxicam.  Follow-up which activities to do which also avoid.  Follow-up in 4 weeks

## 2018-09-30 ENCOUNTER — Other Ambulatory Visit: Payer: Self-pay

## 2018-09-30 ENCOUNTER — Telehealth (INDEPENDENT_AMBULATORY_CARE_PROVIDER_SITE_OTHER): Payer: Medicare Other | Admitting: Neurology

## 2018-09-30 DIAGNOSIS — R569 Unspecified convulsions: Secondary | ICD-10-CM | POA: Diagnosis not present

## 2018-09-30 DIAGNOSIS — F200 Paranoid schizophrenia: Secondary | ICD-10-CM
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
## 2018-10-15 ENCOUNTER — Other Ambulatory Visit: Payer: Self-pay | Admitting: Family Medicine

## 2018-10-21 ENCOUNTER — Ambulatory Visit (INDEPENDENT_AMBULATORY_CARE_PROVIDER_SITE_OTHER): Payer: Medicare Other | Admitting: Neurology

## 2018-10-21 ENCOUNTER — Other Ambulatory Visit: Payer: Self-pay

## 2018-10-21 ENCOUNTER — Ambulatory Visit: Payer: Medicare Other | Admitting: Family Medicine

## 2018-10-21 DIAGNOSIS — R569 Unspecified convulsions: Secondary | ICD-10-CM | POA: Diagnosis not present

## 2018-10-29 ENCOUNTER — Ambulatory Visit (INDEPENDENT_AMBULATORY_CARE_PROVIDER_SITE_OTHER): Payer: Medicare Other | Admitting: Family Medicine

## 2018-10-29 ENCOUNTER — Encounter: Payer: Self-pay | Admitting: Family Medicine

## 2018-10-29 DIAGNOSIS — M545 Low back pain: Secondary | ICD-10-CM | POA: Diagnosis not present

## 2018-10-29 DIAGNOSIS — G8929 Other chronic pain: Secondary | ICD-10-CM

## 2018-10-29 MED ORDER — GABAPENTIN 300 MG PO CAPS
ORAL_CAPSULE | ORAL | 3 refills | Status: DC
Start: 2018-10-29 — End: 2019-04-08

## 2018-10-29 NOTE — Progress Notes (Signed)
Virtual Visit via Video Note  I connected with Margaret Farley on 10/29/18 at 10:30 AM EDT by a video enabled telemedicine application and verified that I am speaking with the correct person using two identifiers.  Location: Patient: Home setting Provider: Office setting   I discussed the limitations of evaluation and management by telemedicine and the availability of in person appointments. The patient expressed understanding and agreed to proceed.  History of Present Illness: Patient is following up from back pain.  Feels the gabapentin has been helping.  Feeling about 80% better at this time.  Patient is resting more comfortably.  Doing exercises more regularly as well.    Observations/Objective: Patient seems in better spirits.  Smiling more   Assessment and Plan: Low back pain responding well.  Gabapentin.  No change in management.  Patient wants to continue to avoid a secondary to the corona outbreak   Follow Up Instructions: Follow-up again in 6 to 12 weeks    I discussed the assessment and treatment plan with the patient. The patient was provided an opportunity to ask questions and all were answered. The patient agreed with the plan and demonstrated an understanding of the instructions.   The patient was advised to call back or seek an in-person evaluation if the symptoms worsen or if the condition fails to improve as anticipated.  I provided 14minutes of face-to-face time during this encounter.   Lyndal Pulley, DO

## 2018-11-05 ENCOUNTER — Telehealth: Payer: Self-pay

## 2018-11-05 ENCOUNTER — Telehealth (INDEPENDENT_AMBULATORY_CARE_PROVIDER_SITE_OTHER): Payer: Medicare Other | Admitting: Neurology

## 2018-11-05 ENCOUNTER — Other Ambulatory Visit: Payer: Self-pay

## 2018-11-05 DIAGNOSIS — G40209 Localization-related (focal) (partial) symptomatic epilepsy and epileptic syndromes with complex partial seizures, not intractable, without status epilepticus: Secondary | ICD-10-CM | POA: Diagnosis not present

## 2018-11-05 DIAGNOSIS — F200 Paranoid schizophrenia: Secondary | ICD-10-CM

## 2018-11-05 MED ORDER — OXCARBAZEPINE 300 MG PO TABS: ORAL_TABLET | ORAL | 5 refills | Status: DC
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
## 2018-11-05 NOTE — Progress Notes (Signed)
Called [facility] they called her to schedule MRI may 14th at 12:47 no answer left message for her to call back to get MRI scheduled she never called back. IM going to call her and give her the number to call and get the MRI scheduled

## 2018-11-05 NOTE — Telephone Encounter (Signed)
Spoke with pt mother Lovey Newcomer and gave her the number to call at [place] so that Aviella can schedule her MRI. [facility] had tried to call them on May 14th at 12:47 to get her scheduled no answer voice mail was left for them to call back.

## 2018-11-05 NOTE — Progress Notes (Signed)
Noted, thanks!

## 2018-11-19 ENCOUNTER — Other Ambulatory Visit: Payer: Self-pay

## 2018-11-19 DIAGNOSIS — F200 Paranoid schizophrenia: Secondary | ICD-10-CM

## 2018-11-19 DIAGNOSIS — R569 Unspecified convulsions: Secondary | ICD-10-CM

## 2018-11-19 DIAGNOSIS — G40209 Localization-related (focal) (partial) symptomatic epilepsy and epileptic syndromes with complex partial seizures, not intractable, without status epilepticus: Secondary | ICD-10-CM

## 2018-11-20 ENCOUNTER — Telehealth: Payer: Self-pay

## 2018-11-20 NOTE — Telephone Encounter (Signed)
Pt needs to have her MRI done before she can have her LP done [facility] stated that they will call her to get them both scheduled

## 2018-12-05 ENCOUNTER — Telehealth: Payer: Self-pay | Admitting: Neurology

## 2018-12-05 NOTE — Procedures (Signed)
ELECTROENCEPHALOGRAM REPORT  Dates of Recording: 10/21/2018 11:56AM to 10/23/2018 12:15PM  Patient's Name: Margaret Farley MRN: [id number] Date of Birth: [birth year]  Referring Provider: Dr. Ellouise Newer  Procedure: 48-hour ambulatory EEG  History: This is a 28 year old woman with 3 seizures since 2012, most recently in April 2020 where she was noted to have head version to the right. She has had a significant psychiatric history since 2014 with paranoia and auditory hallucinations. The question of autoimmune encephalitis has been raised. EEG for classification.  Medications: Gabapentin, Ativan, Seroquel, Haldol, Trintellix  Technical Summary: This is a 48-hour multichannel digital video EEG recording measured by the international 10-20 system with electrodes applied with paste and impedances below 5000 ohms performed as portable with EKG monitoring.  The digital EEG was referentially recorded, reformatted, and digitally filtered in a variety of bipolar and referential montages for optimal display.    DESCRIPTION OF RECORDING: During maximal wakefulness, the background activity consisted of a symmetric 9 Hz posterior dominant rhythm which was reactive to eye opening.  There were no epileptiform discharges or focal slowing seen in wakefulness.  During the recording, the patient progresses through wakefulness, drowsiness, and Stage 2 sleep.  There is an increase in theta and delta slowing during sleep, at times sharply contoured over the left temporal region without clear epileptogenic potential. There were no clear epileptiform discharges seen.  Events: On 6/1 at 1429 hours, rhythmic 5-6 Hz theta activity is seen over the left temporal region lasting for around 1 minute. Patient is off the camera, no symptoms reported.  On 6/1 at 1414 hours, she reports the auditory hallucinations "voices" bad. Electrographically, there were no EEG or EKG changes seen.  On 6/1 at 1630 hours, she states "voices  really bad." Electrographically, there were no EEG or EKG changes seen.  On 6/2 at 1851 hours, she reports heart rate up, voices are bad. Electrographically, there were no EEG changes seen. EKG lead showed HR 108 bpm.  On 6/3 at 0700 hours, she is crying, battling the voices is so hard. Electrographically, there were no EEG or EKG changes seen.   IMPRESSION: This 48-hour ambulatory video EEG study is abnormal due to an episode of rhythmic left temporal theta activity concerning for electrographic seizure.    CLINICAL CORRELATION: There was one episode lasting 1 minute concerning for an electrographic seizure arising from the left temporal region. Patient is not on video and movement artifact is still a possibility. Episodes where auditory hallucinations were worse did not show epileptiform correlate.  If further clinical questions remain, inpatient video EEG monitoring may be helpful.   Ellouise Newer, M.D.

## 2018-12-05 NOTE — Telephone Encounter (Signed)
Line busy at 308 12/05/2018

## 2018-12-05 NOTE — Telephone Encounter (Signed)
Pls order open MRI brain with and without contrast, thanks

## 2018-12-05 NOTE — Telephone Encounter (Signed)
New Message  Patient's mom called and said patient is claustrophobic and cannot do a closed MRI and would like to see what other options we have.  Please f/u

## 2018-12-05 NOTE — Telephone Encounter (Signed)
Please advise on change or order

## 2018-12-06 ENCOUNTER — Other Ambulatory Visit: Payer: Self-pay

## 2018-12-06 DIAGNOSIS — G40909 Epilepsy, unspecified, not intractable, without status epilepticus: Secondary | ICD-10-CM

## 2018-12-06 NOTE — Progress Notes (Unsigned)
Ordered mri, open

## 2018-12-09 ENCOUNTER — Other Ambulatory Visit: Payer: Self-pay | Admitting: Family Medicine

## 2018-12-11 ENCOUNTER — Telehealth: Payer: Self-pay | Admitting: Neurology

## 2018-12-11 MED ORDER — DIAZEPAM 5 MG PO TABS: ORAL_TABLET | ORAL | 0 refills | Status: DC

## 2018-12-11 NOTE — Telephone Encounter (Signed)
I don't prescribe Xanax and am not comfortable with taking a higher dose of what she is already taking. Recommend she take Valium 5mg  1 dose 30 mins prior to MRI, then can take second dose if needed, Rx # 2 with no refills. Depending on what time she is doing MRI, would not take the Xanax and Valium at same time (if MRI in AM, don't take Xanax that morning, or if MRI in afternoon, don't take noon dose). Thanks

## 2018-12-11 NOTE — Telephone Encounter (Signed)
Spoke to mom [place] and informed her of the Valium 5mg  x1 for patient to take 30 minutes prior to MRI. She is aware patient can take another one if needed. Mom verbalizes understanding for patient to NOT take the scheduled Xanax around the time of the MRI/Valium (she is aware patient will have one missed dose of the Xanax that day).  Valium 5mg  x1 to be taken 30 minutes prior to MRI (with option to take one more if needed) escribed to pharmacy.  ~Dr. Delice Lesch  Please sign order , Rx attached.

## 2018-12-11 NOTE — Telephone Encounter (Signed)
Mother is calling in about MRI- patient is very scared of MRI, she has to be sedated to get one and she is wanting to know what yall would like for her to do. She can not go through with the MRI unless she gets sedated. Thanks!

## 2018-12-11 NOTE — Telephone Encounter (Signed)
Spoke to mother and Dr. Ines Bloomer at [facility] stopped the Ativan and she now is on Xanax 2mg  TID. How much of this should she take prior to MRI? Thanks.

## 2018-12-11 NOTE — Telephone Encounter (Signed)
Pls let mother know that MRI under sedation is done in the hospital, and with Covid situation, I think it is best that we try first with open MRI and take Ativan 0.5mg  2 tabs (1mg  total) 30 mins prior to MRI, and may take another 2 tabs (1 mg total) during study if needed. Thanks

## 2018-12-11 NOTE — Telephone Encounter (Signed)
Left message for mom to call back in response to her request for sedation for open MRI. Called MRI and they do not do sedation - other option is if MD orders patient to take own med 30 minutes prior to arrival to MRI. Patient has ordered Ativan 0.5mg  TID as a scheduled med. Please advise dose for patient to take prior to MRI. Thanks.

## 2018-12-12 MED ORDER — DIAZEPAM 5 MG PO TABS: ORAL_TABLET | ORAL | 0 refills | Status: DC

## 2018-12-12 NOTE — Telephone Encounter (Signed)
Noted, order signed electronically. Thanks!

## 2018-12-16 ENCOUNTER — Telehealth: Payer: Self-pay | Admitting: Neurology

## 2018-12-16 DIAGNOSIS — G40909 Epilepsy, unspecified, not intractable, without status epilepticus: Secondary | ICD-10-CM

## 2018-12-16 DIAGNOSIS — R569 Unspecified convulsions: Secondary | ICD-10-CM

## 2018-12-16 NOTE — Addendum Note (Signed)
Addended by: Jesse Fall on: 12/16/2018 03:21 PM   Modules accepted: Orders

## 2018-12-16 NOTE — Telephone Encounter (Signed)
Patient was so scared she could not complete MRI this morning. Thanks!

## 2018-12-16 NOTE — Telephone Encounter (Signed)
MRI brain with/without contrast order placed to be done under sedation. Have left message with Aniceto Boss at MRI scheduling to make sure that this can be scheduled at this time as were previous delays due to COVID 19. Awaiting return call then after MRI is booked will make appt with Dr. Delice Lesch and make pt/mom aware of above.

## 2018-12-16 NOTE — Telephone Encounter (Signed)
Patient's mom Lovey Newcomer called and patient was unable to stay in open MRI machine this am and have it completed. Patient did have both of the Valium 5mg  pre meds this am. Last MRI four years ago was in the hospital under sedation per mom. Mom was asking what the next step would be regarding the MRI. Thanks.

## 2018-12-16 NOTE — Telephone Encounter (Signed)
Pls order MRI brain with and without contrast under sedation, this would be in the hospital. I will need to have a visit with her prior to the MRI as per protocol, so once she has a date, pls let us know so she can be put on my schedule. Thanks

## 2018-12-17 NOTE — Telephone Encounter (Signed)
Mother called to ask about update r/t MRI. I told her that MRI scheduling is aware and will call me back in the am 7/29 to finalize and I will call mom back at that time with the details.

## 2018-12-18 NOTE — Telephone Encounter (Signed)
Spoke to [name] at MRI scheduling regarding needed paperwork from MD. The H&P and copy of most recent office notes (will be seen Thurs 8/13)  are to be faxed to MRI scheduling no later than 3pm the day before the procedure. This will be by 3pm on Monday 8/17.

## 2018-12-18 NOTE — Telephone Encounter (Addendum)
Spoke to [place] at [employer] [phone]). Patient has MRI with sedation scheduled for August 18 at 0800 at [facility]. She is to arrive at admitting at 0600. Pre surgical testing nurse will call prior to this appt to gather info/review instructions. Patient is to be NPO after midnight and have a driver.   Prior to this MRI she must have an appt with MD (virtual visit 8/13 at noon scheduled with Dr. Delice Lesch). MD requests mom check patient's BP/HR prior to this appt. Patient to take seizure med (Trileptal) prior to MRI (okay on empty stomach) per Dr. Delice Lesch.  History and Physical form obtained from [facility] and will be given to MD to fill out and will be faxed back prior to MRI.    Patient must have Covid - 19 test done prior to MRI and this appt is for 8/13 between 8-11 am to drive through at the old [facility] hospital site ([address]).   Spoke to mom [place] and told her all the above information (she relayed it all back to me to confirm understanding). She verified patient is not pregnant (question per MRI scheduler). Mom is aware of the time/date for covid testing, virtual MD appt, MRI and all instructions.

## 2019-01-02 ENCOUNTER — Telehealth (INDEPENDENT_AMBULATORY_CARE_PROVIDER_SITE_OTHER): Payer: Medicare Other | Admitting: Neurology

## 2019-01-02 ENCOUNTER — Encounter: Payer: Self-pay | Admitting: Neurology

## 2019-01-02 ENCOUNTER — Other Ambulatory Visit: Payer: Self-pay

## 2019-01-02 ENCOUNTER — Ambulatory Visit (HOSPITAL_COMMUNITY): Payer: Medicare Other

## 2019-01-02 VITALS — BP 120/80 | HR 84 | Ht 63.0 in | Wt 150.0 lb

## 2019-01-02 DIAGNOSIS — F200 Paranoid schizophrenia: Secondary | ICD-10-CM

## 2019-01-02 DIAGNOSIS — G40209 Localization-related (focal) (partial) symptomatic epilepsy and epileptic syndromes with complex partial seizures, not intractable, without status epilepticus: Secondary | ICD-10-CM
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
## 2019-01-03 ENCOUNTER — Other Ambulatory Visit (HOSPITAL_COMMUNITY)
Admission: RE | Admit: 2019-01-03 | Discharge: 2019-01-03 | Disposition: A | Discharge status: Home / Self Care | Payer: Medicare Other | Source: Ambulatory Visit | Attending: Neurology | Admitting: Neurology

## 2019-01-03 ENCOUNTER — Telehealth: Payer: Self-pay

## 2019-01-03 DIAGNOSIS — Z01812 Encounter for preprocedural laboratory examination: Secondary | ICD-10-CM | POA: Insufficient documentation

## 2019-01-03 DIAGNOSIS — Z20828 Contact with and (suspected) exposure to other viral communicable diseases: Secondary | ICD-10-CM | POA: Diagnosis not present

## 2019-01-03 LAB — SARS CORONAVIRUS 2 (TAT 6-24 HRS): SARS Coronavirus 2: NEGATIVE

## 2019-01-03 NOTE — Telephone Encounter (Signed)
Per Aniceto Boss with [facility] the most recent office notes needs to be faxed prior to pts MRI with sedation. Pt is scheduled to have MRI 01/07/19. Office note from 01/02/19 visit faxed Attention Tasha to [phone].

## 2019-01-06 ENCOUNTER — Encounter (HOSPITAL_COMMUNITY): Payer: Self-pay | Admitting: Vascular Surgery

## 2019-01-06 ENCOUNTER — Other Ambulatory Visit: Payer: Self-pay

## 2019-01-06 ENCOUNTER — Telehealth: Payer: Self-pay | Admitting: *Deleted

## 2019-01-06 NOTE — Progress Notes (Signed)
I spoke with Margaret Farley with her mother on speaker phone.  Margaret Farley has been in quarantine since Covid test 01/03/2019. Margaret Farley denies chest pain or shortness of breath.  Margaret Farley reports that last seizeure was 08/2018.  PCP is Tobie Lords, FNP, [place].

## 2019-01-06 NOTE — Progress Notes (Signed)
Anesthesia Chart Review: Margaret Farley  Case: [id number] Date/Time: 01/07/19 0745   Procedure: MRI WITH ANESTHESIA BRAIN WITH AND WITHOUT CONTRAST (N/A )   Anesthesia type: General   Pre-op diagnosis: SEIZURE DISORDER   Location: [facility] OR RADIOLOGY ROOM / [place] OR   Surgeon: Radiologist, Medication, MD      DISCUSSION: Margaret Farley is a 28 year old female scheduled for MRI of the brain under anesthesia on 01/07/19. MRI was ordered by Ellouise Newer, MD, with visit/H&P 01/02/19.  History includes former smoker, ADD, Schizophrenia (with history of paranoia, auditory hallucinations), seizures (first in 2012)  Margaret Farley is for labs including pregnancy test on the day of surgery. 01/03/19 pre-procedure COVID test negative. Anesthesia team to evaluate on the day of surgery.    PROVIDERS: Lucretia Kern, DO is listed as PCP. There are also recent visits with Tobie Lords, FNP at [facility]. Ellouise Newer, MD is neurologist   LABS: Margaret Farley is for updated labs on arrival. As of 07/2017, Cr, CBC, TSH, A1c WNL.   OTHER: Ambulatory EEG 10/21/18: IMPRESSION: This 48-hour ambulatory video EEG study is abnormal due to an episode of rhythmic left temporal theta activity concerning for electrographic seizure.   CLINICAL CORRELATION: There was one episode lasting 1 minute concerning for an electrographic seizure arising from the left temporal region. Margaret Farley is not on video and movement artifact is still a possibility. Episodes where auditory hallucinations were worse did not show epileptiform correlate.  If further clinical questions remain, inpatient video EEG monitoring may be helpful.   IMAGES: CXR 06/22/18: IMPRESSION: No active cardiopulmonary disease.   EKG: EKG 07/25/17: Sinus tachycardia at 104 bpm Possible Left atrial enlargement Incomplete right bundle branch block Nonspecific T wave abnormality Abnormal ECG No old tracing to compare Confirmed by Dixie Dials ([id number]) on 07/26/2017  9:18:14 AM   CV: N/A   Past Medical History:  Diagnosis Date  . ADD (attention deficit disorder)   . Allergic rhinitis   . Anxiety   . Depression   . Schizophrenia ([place])   . Seizure ([place])     No past surgical history on file.  MEDICATIONS: No current facility-administered medications for this encounter.    Marland Kitchen alprazolam (XANAX) 2 MG tablet  . gabapentin (NEURONTIN) 300 MG capsule  . haloperidol (HALDOL) 10 MG tablet  . Oxcarbazepine (TRILEPTAL) 300 MG tablet  . pantoprazole (PROTONIX) 40 MG tablet  . QUEtiapine (SEROQUEL) 100 MG tablet  . tretinoin (RETIN-A) 0.025 % cream  . Vitamin D, Ergocalciferol, (DRISDOL) 1.25 MG (50000 UT) CAPS capsule  . vortioxetine HBr (TRINTELLIX) 10 MG TABS tablet  . zolpidem (AMBIEN) 10 MG tablet  . meloxicam (MOBIC) 15 MG tablet    Margaret Gianotti, PA-C Surgical Short Stay/Anesthesiology [facility] Phone [phone] [facility] Phone [phone] 01/06/2019 2:25 PM

## 2019-01-06 NOTE — Telephone Encounter (Signed)
Called Margaret Farley at Centralized scheduling [phone] to CONFIRM they received faxed office notes on 8/14 (MD visit 8/13). Margaret Farley confirmed they did have all that they needed for patient's MRI with sedation tomorrow am (8/18).

## 2019-01-06 NOTE — Anesthesia Preprocedure Evaluation (Addendum)
Anesthesia Evaluation  Patient identified by MRN, date of birth, ID band Patient awake    Reviewed: Allergy & Precautions, H&P , NPO status , Patient's Chart, lab work & pertinent test results  Airway Mallampati: I   Neck ROM: full    Dental   Pulmonary former smoker,    breath sounds clear to auscultation       Cardiovascular negative cardio ROS   Rhythm:regular Rate:Normal     Neuro/Psych Seizures -,  PSYCHIATRIC DISORDERS Anxiety Depression Schizophrenia    GI/Hepatic   Endo/Other    Renal/GU      Musculoskeletal   Abdominal   Peds  Hematology   Anesthesia Other Findings   Reproductive/Obstetrics                            Anesthesia Physical Anesthesia Plan  ASA: II  Anesthesia Plan: General   Post-op Pain Management:    Induction: Intravenous  PONV Risk Score and Plan: 3 and Ondansetron, Dexamethasone, Midazolam and Treatment may vary due to age or medical condition  Airway Management Planned: Oral ETT  Additional Equipment:   Intra-op Plan:   Post-operative Plan: Extubation in OR  Informed Consent: I have reviewed the patients History and Physical, chart, labs and discussed the procedure including the risks, benefits and alternatives for the proposed anesthesia with the patient or authorized representative who has indicated his/her understanding and acceptance.       Plan Discussed with: CRNA, Anesthesiologist and Surgeon  Anesthesia Plan Comments: (PAT note written 01/06/2019 by Myra Gianotti, PA-C. )       Anesthesia Quick Evaluation

## 2019-01-07 ENCOUNTER — Encounter (HOSPITAL_COMMUNITY): Payer: Self-pay | Admitting: *Deleted

## 2019-01-07 ENCOUNTER — Ambulatory Visit (HOSPITAL_COMMUNITY)
Admission: RE | Admit: 2019-01-07 | Discharge: 2019-01-07 | Disposition: A | Discharge status: Home / Self Care | Payer: Medicare Other | Source: Ambulatory Visit | Attending: Neurology | Admitting: Neurology

## 2019-01-07 ENCOUNTER — Ambulatory Visit (HOSPITAL_COMMUNITY)
Admission: RE | Admit: 2019-01-07 | Discharge: 2019-01-07 | Disposition: A | Discharge status: Home / Self Care | Payer: Medicare Other | Attending: Neurology | Admitting: Neurology

## 2019-01-07 ENCOUNTER — Ambulatory Visit (HOSPITAL_COMMUNITY): Payer: Medicare Other | Admitting: Anesthesiology

## 2019-01-07 ENCOUNTER — Encounter (HOSPITAL_COMMUNITY)
Admission: RE | Disposition: A | Discharge status: Home / Self Care | Payer: Self-pay | Source: Home / Self Care | Attending: Neurology

## 2019-01-07 ENCOUNTER — Other Ambulatory Visit: Payer: Self-pay

## 2019-01-07 DIAGNOSIS — Z79899 Other long term (current) drug therapy: Secondary | ICD-10-CM | POA: Diagnosis not present

## 2019-01-07 DIAGNOSIS — F209 Schizophrenia, unspecified: Secondary | ICD-10-CM | POA: Insufficient documentation

## 2019-01-07 DIAGNOSIS — R569 Unspecified convulsions: Secondary | ICD-10-CM

## 2019-01-07 DIAGNOSIS — F988 Other specified behavioral and emotional disorders with onset usually occurring in childhood and adolescence: Secondary | ICD-10-CM | POA: Insufficient documentation

## 2019-01-07 DIAGNOSIS — F419 Anxiety disorder, unspecified: Secondary | ICD-10-CM | POA: Insufficient documentation

## 2019-01-07 DIAGNOSIS — Z791 Long term (current) use of non-steroidal anti-inflammatories (NSAID): Secondary | ICD-10-CM | POA: Diagnosis not present

## 2019-01-07 DIAGNOSIS — G40909 Epilepsy, unspecified, not intractable, without status epilepticus: Secondary | ICD-10-CM | POA: Diagnosis not present

## 2019-01-07 DIAGNOSIS — F329 Major depressive disorder, single episode, unspecified: Secondary | ICD-10-CM | POA: Diagnosis not present

## 2019-01-07 DIAGNOSIS — Z87891 Personal history of nicotine dependence: Secondary | ICD-10-CM | POA: Insufficient documentation

## 2019-01-07 HISTORY — DX: Unspecified convulsions: R56.9

## 2019-01-07 HISTORY — PX: RADIOLOGY WITH ANESTHESIA: SHX6223

## 2019-01-07 LAB — CBC
HCT: 39.7 % (ref 36.0–46.0)
Hemoglobin: 13.3 g/dL (ref 12.0–15.0)
MCH: 28.9 pg (ref 26.0–34.0)
MCHC: 33.5 g/dL (ref 30.0–36.0)
MCV: 86.3 fL (ref 80.0–100.0)
Platelets: 208 10*3/uL (ref 150–400)
RBC: 4.6 MIL/uL (ref 3.87–5.11)
RDW: 12.3 % (ref 11.5–15.5)
WBC: 8.8 10*3/uL (ref 4.0–10.5)
nRBC: 0 % (ref 0.0–0.2)

## 2019-01-07 LAB — BASIC METABOLIC PANEL
Anion gap: 10 (ref 5–15)
BUN: 14 mg/dL (ref 6–20)
CO2: 22 mmol/L (ref 22–32)
Calcium: 9.2 mg/dL (ref 8.9–10.3)
Chloride: 107 mmol/L (ref 98–111)
Creatinine, Ser: 0.97 mg/dL (ref 0.44–1.00)
GFR calc Af Amer: >60 mL/min (ref >60)
GFR calc non Af Amer: >60 mL/min (ref >60)
Glucose, Bld: 99 mg/dL (ref 70–99)
Potassium: 3.8 mmol/L (ref 3.5–5.1)
Sodium: 139 mmol/L (ref 135–145)

## 2019-01-07 LAB — POCT PREGNANCY, URINE: Preg Test, Ur: NEGATIVE

## 2019-01-07 IMAGING — MR MRI HEAD WITHOUT AND WITH CONTRAST
16 of 18 series · 40 of 48 positions shown · IV contrast (gadavist)
Comparison: Brain MRI [DATE]

CLINICAL DATA: Seizure disorder. Additional history: Patient with 2
year history of seizures and hallucinations.

EXAM:
MRI HEAD WITHOUT AND WITH CONTRAST
TECHNIQUE: Multiplanar, multiecho pulse sequences of the brain and surrounding
structures were obtained without and with intravenous contrast. A
seizure protocol was utilized.
CONTRAST:  7 mL Gadavist

[Series 5: DWI · axial · 3.0mm · 0.88mm/px · z∈[-80,+66]mm · 5 of 100 slices shown (1 of 4)]
[im 1/100]
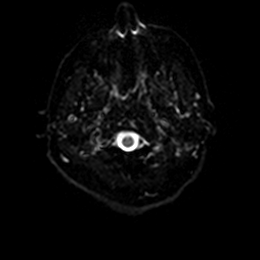
[im 25/100]
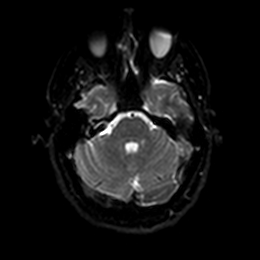
[im 50/100]
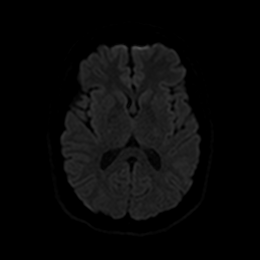
[im 75/100]
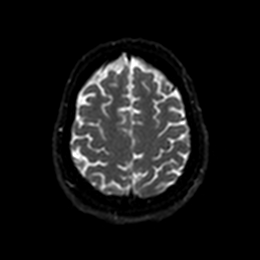
[im 100/100]
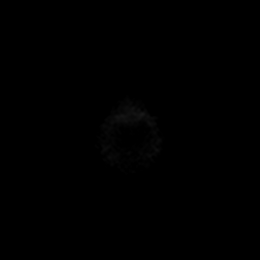

[Series 6: DWI · axial · 3.0mm · 0.88mm/px · z∈[-80,+66]mm · 3 of 50 slices shown (2 of 4)]
[im 1/50]
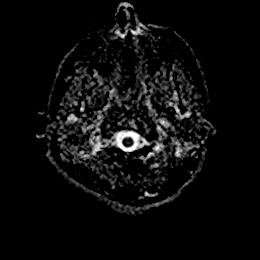
[im 25/50]
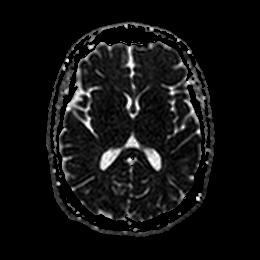
[im 50/50]
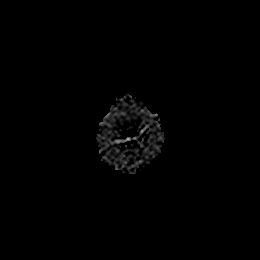

[Series 7: DWI · coronal · 4.0mm · 0.88mm/px · 4 of 64 slices shown (3 of 4)]
[im 1/64]
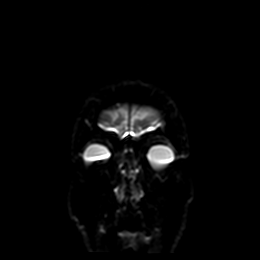
[im 22/64]
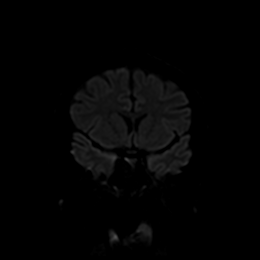
[im 43/64]
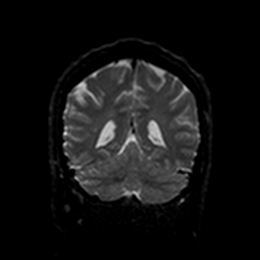
[im 64/64]
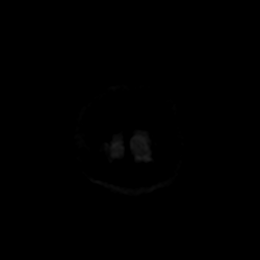

[Series 8: DWI · coronal · 4.0mm · 0.88mm/px · 2 of 32 slices shown (4 of 4)]
[im 1/32]
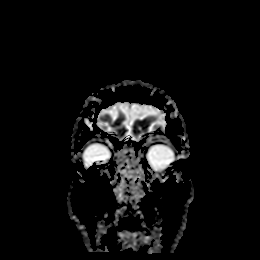
[im 32/32]
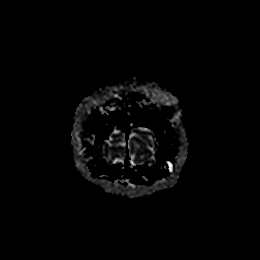

[Series 9: T1 · sagittal · 5.0mm · 0.75mm/px · 1 of 23 slices shown]
[im 1/23]
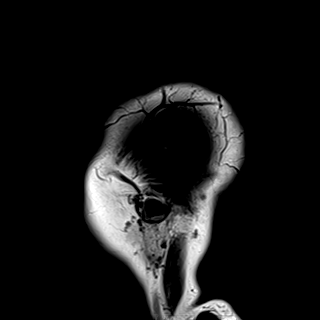

[Series 10: T2 · axial · 5.0mm · 0.72mm/px · 1 of 25 slices shown (1 of 2)]
[im 1/25]
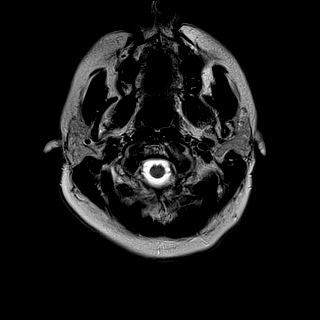

[Series 11: FLAIR · axial · 5.0mm · 0.45mm/px · 1 of 25 slices shown (1 of 2)]
[im 1/25]
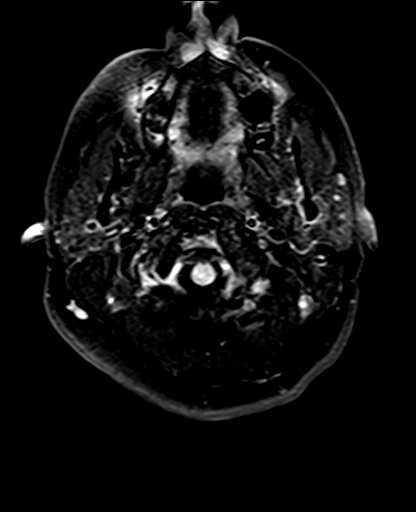

[Series 12: mag_images · axial · 3.0mm · 0.90mm/px · z∈[-90,+87]mm · 4 of 60 slices shown]
[im 1/60]
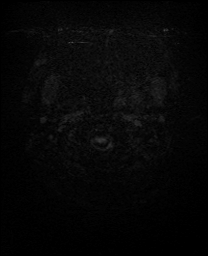
[im 20/60]
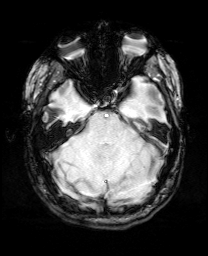
[im 40/60]
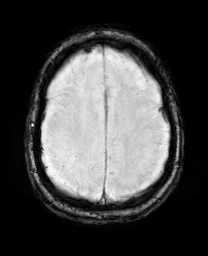
[im 60/60]
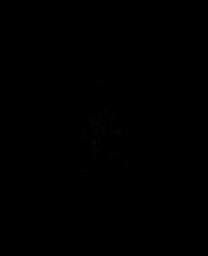

[Series 13: pha_images · axial · 3.0mm · 0.90mm/px · z∈[-90,+87]mm · 4 of 60 slices shown]
[im 1/60]
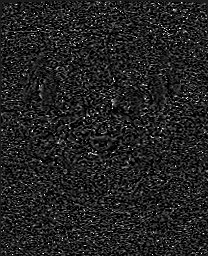
[im 20/60]
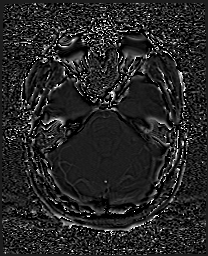
[im 40/60]
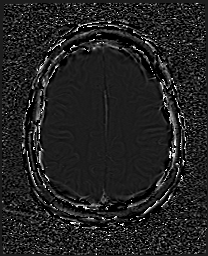
[im 60/60]
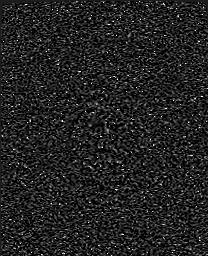

[Series 14: swi_images · axial · 3.0mm · 0.90mm/px · z∈[-90,+87]mm · 4 of 60 slices shown]
[im 1/60]
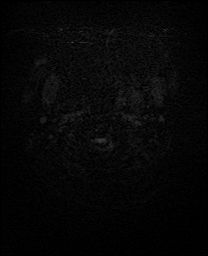
[im 20/60]
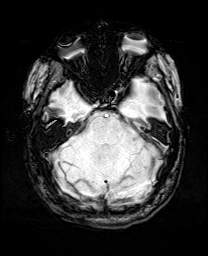
[im 40/60]
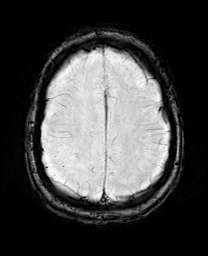
[im 60/60]
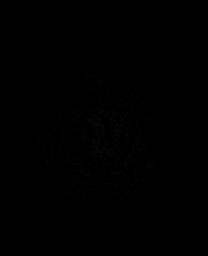

[Series 15: mip_images(sw) · axial · 24.0mm · 0.90mm/px · z∈[-80,+76]mm · 3 of 53 slices shown]
[im 1/53]
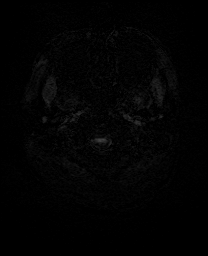
[im 27/53]
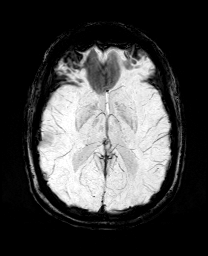
[im 53/53]
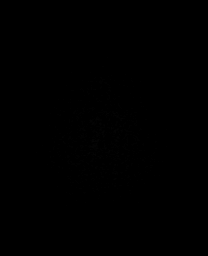

[Series 17: T2 · coronal · 3.0mm · 0.27mm/px · 2 of 32 slices shown (2 of 2)]
[im 1/32]
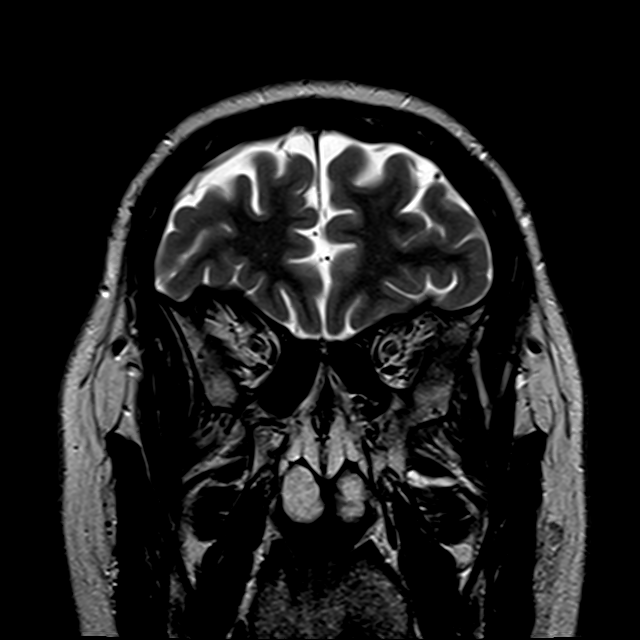
[im 32/32]
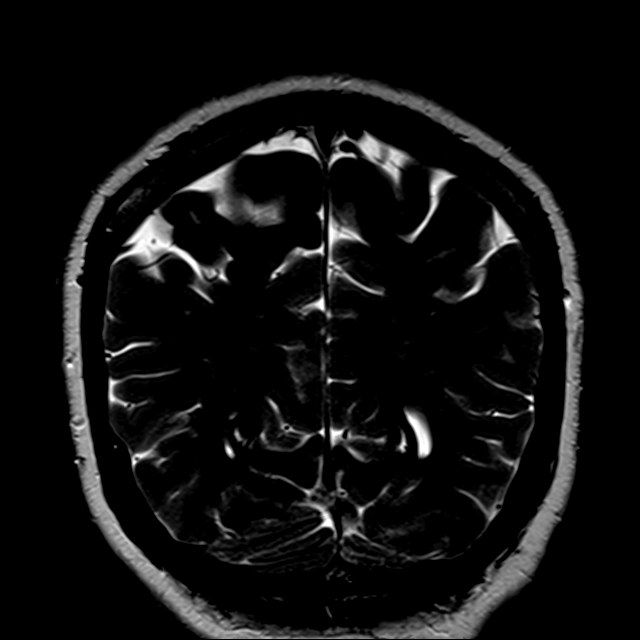

[Series 18: FLAIR · coronal · 3.0mm · 0.56mm/px · 1 of 25 slices shown (2 of 2)]
[im 1/25]
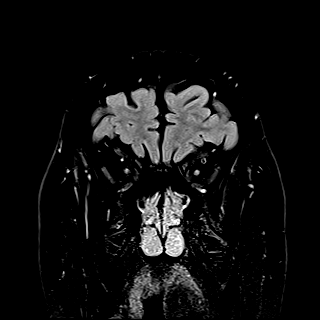

[Series 19: T2 post-contrast · coronal · 5.0mm · 0.72mm/px · 2 of 28 slices shown]
[im 1/28]
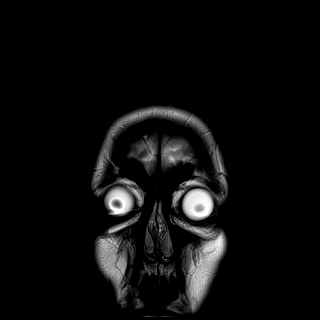
[im 28/28]
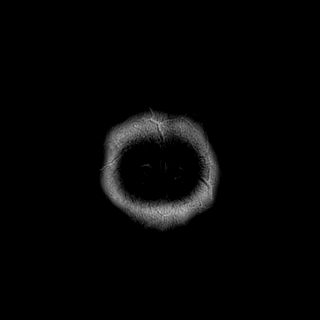

[Series 21: T1 post-contrast · coronal · 5.0mm · 0.34mm/px · 2 of 28 slices shown (1 of 2)]
[im 1/28]
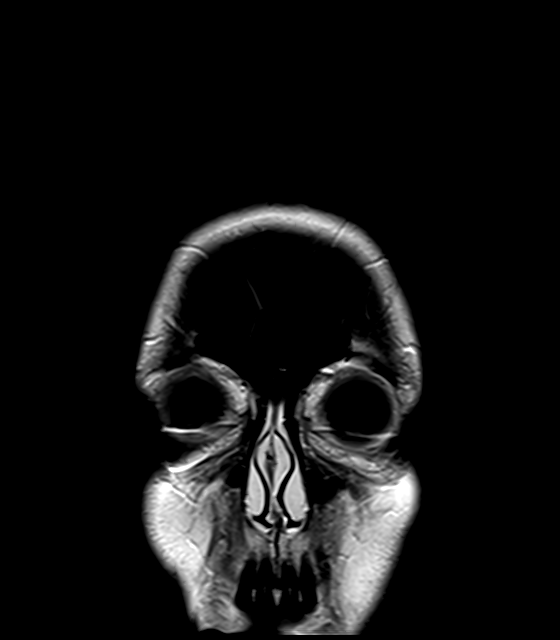
[im 28/28]
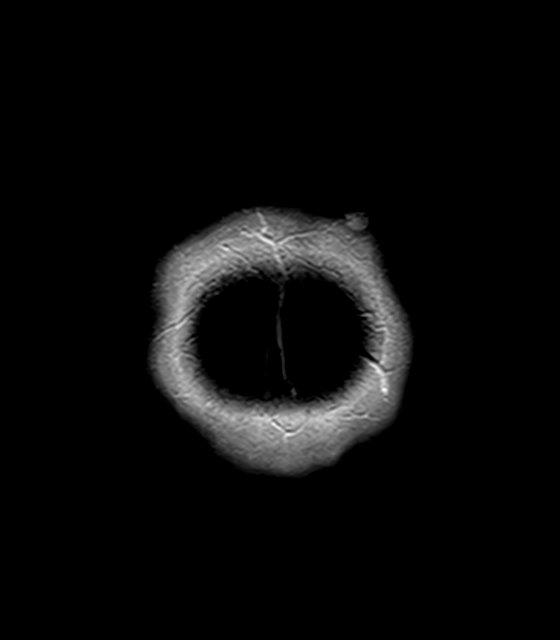

[Series 22: T1 post-contrast · sagittal · 5.0mm · 0.72mm/px · 1 of 23 slices shown (2 of 2)]
[im 1/23]
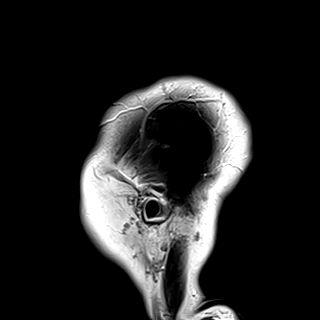

[40 of 48 positions shown; findings below may reference images not displayed]

FINDINGS: Brain: There is no evidence of acute infarct. No evidence of
intracranial mass. No chronic intracranial hemorrhage. No midline
shift or extra-axial collection. Minimal scattered T2/FLAIR
hyperintensity within the periatrial white matter bilaterally is
unchanged from prior exam and nonspecific. No other focal
parenchymal signal abnormality. The hippocampi are symmetric in size
and signal. Cerebral volume is normal. Mildly low-lying cerebellar
tonsils. No abnormal intracranial enhancement.

Vascular: Flow voids maintained within the proximal large vessels.

Skull and upper cervical spine: Normal marrow signal.

Sinuses/Orbits: Mild ethmoid sinus mucosal thickening. Trace fluid
within left mastoid air cells
IMPRESSION: Stable appearance of the brain as compared to MRI [DATE].

No evidence intracranial mass or acute intracranial abnormality. No
seizure focus is identified.

## 2019-01-07 SURGERY — MRI WITH ANESTHESIA
Anesthesia: General

## 2019-01-07 MED ORDER — GADOBUTROL 1 MMOL/ML IV SOLN
7.0000 mL | Freq: Once | INTRAVENOUS | Status: AC | PRN
  Administered 2019-01-07: 7 mL via INTRAVENOUS

## 2019-01-07 MED ORDER — FENTANYL CITRATE (PF) 100 MCG/2ML IJ SOLN: 25.0000 ug | INTRAMUSCULAR | Status: DC | PRN

## 2019-01-07 MED ORDER — ONDANSETRON HCL 4 MG/2ML IJ SOLN: 4.0000 mg | Freq: Four times a day (QID) | INTRAMUSCULAR | Status: DC | PRN

## 2019-01-07 MED ORDER — OXYCODONE HCL 5 MG/5ML PO SOLN: 5.0000 mg | Freq: Once | ORAL | Status: DC | PRN

## 2019-01-07 MED ORDER — LACTATED RINGERS IV SOLN
INTRAVENOUS | Status: DC
  Administered 2019-01-07: 07:00:00 via INTRAVENOUS

## 2019-01-07 MED ORDER — OXYCODONE HCL 5 MG PO TABS: 5.0000 mg | ORAL_TABLET | Freq: Once | ORAL | Status: DC | PRN

## 2019-01-07 NOTE — Transfer of Care (Signed)
Immediate Anesthesia Transfer of Care Note  Patient: Margaret Farley  Procedure(s) Performed: MRI WITH ANESTHESIA BRAIN WITH AND WITHOUT CONTRAST (N/A )  Patient Location: PACU  Anesthesia Type:General  Level of Consciousness: awake, alert  and oriented  Airway & Oxygen Therapy: Patient Spontanous Breathing and Patient connected to nasal cannula oxygen  Post-op Assessment: Report given to RN, Post -op Vital signs reviewed and stable and Patient moving all extremities  Post vital signs: Reviewed and stable  Last Vitals:  Vitals Value Taken Time  BP 107/92 01/07/19 0931  Temp 36.4 C 01/07/19 0931  Pulse    Resp 12 01/07/19 0931  SpO2 97 % 01/07/19 0931    Last Pain:  Vitals:   01/07/19 0657  TempSrc:   PainSc: 0-No pain      Patients Stated Pain Goal: 3 (26/33/35 4562)  Complications: No apparent anesthesia complications

## 2019-01-08 ENCOUNTER — Encounter (HOSPITAL_COMMUNITY): Payer: Self-pay | Admitting: Radiology

## 2019-01-08 ENCOUNTER — Telehealth: Payer: Self-pay

## 2019-01-08 MED FILL — Lidocaine HCl Local Soln Prefilled Syringe 100 MG/5ML (2%): INTRAMUSCULAR | Qty: 5 | Status: AC

## 2019-01-08 MED FILL — Rocuronium Bromide IV Soln 50 MG/5ML (10 MG/ML): INTRAVENOUS | Qty: 5 | Status: AC

## 2019-01-08 MED FILL — Dexamethasone Sodium Phosphate Inj 10 MG/ML: INTRAMUSCULAR | Qty: 1 | Status: AC

## 2019-01-08 MED FILL — Phenylephrine-NaCl Pref Syr 0.4 MG/10ML-0.9% (40 MCG/ML): INTRAVENOUS | Qty: 10 | Status: AC

## 2019-01-08 MED FILL — Sugammadex Sodium IV 200 MG/2ML (Base Equivalent): INTRAVENOUS | Qty: 2 | Status: AC

## 2019-01-08 MED FILL — Propofol IV Emul 200 MG/20ML (10 MG/ML): INTRAVENOUS | Qty: 20 | Status: AC

## 2019-01-08 MED FILL — Midazolam HCl Inj 2 MG/2ML (Base Equivalent): INTRAMUSCULAR | Qty: 2 | Status: AC

## 2019-01-08 MED FILL — Lactated Ringer's Solution: INTRAVENOUS | Qty: 1000 | Status: AC

## 2019-01-08 MED FILL — Fentanyl Citrate Preservative Free (PF) Inj 100 MCG/2ML: INTRAMUSCULAR | Qty: 2 | Status: AC

## 2019-01-08 MED FILL — Ondansetron HCl Inj 4 MG/2ML (2 MG/ML): INTRAMUSCULAR | Qty: 2 | Status: AC

## 2019-01-08 NOTE — Telephone Encounter (Signed)
Pls order LP under fluoro and send CSF for:  1. CSF cell count  2. CSF protein and glucose  3. Autoimmune panel through [facility] (I will have to sign [facility] form I think) 4. CSF cryptococcal Ag  5. CSF fungal culture  6. Cytology   Thanks!

## 2019-01-08 NOTE — Telephone Encounter (Signed)
Mom informed of MRI results. She states that it has been awhile since the last LP.  What lab orders do you need for the LP? Does it need to be scheduled at the hosptial?

## 2019-01-08 NOTE — Telephone Encounter (Signed)
-----   Message from Cameron Sprang, MD sent at 01/08/2019  8:38 AM EDT ----- Pls let patient/mother know that the MRI brain did not show any evidence of tumor, stroke, bleed, or inflammation, no change from MRI in 2016. Proceed with LP, thanks

## 2019-01-09 ENCOUNTER — Other Ambulatory Visit: Payer: Self-pay

## 2019-01-09 DIAGNOSIS — G40209 Localization-related (focal) (partial) symptomatic epilepsy and epileptic syndromes with complex partial seizures, not intractable, without status epilepticus: Secondary | ICD-10-CM

## 2019-01-09 DIAGNOSIS — R569 Unspecified convulsions: Secondary | ICD-10-CM

## 2019-01-09 NOTE — Telephone Encounter (Signed)
Scheduled LP at [facility] for 01/23/19 at 9 am. Pt will need to arrive 90 minutes early and be NPO starting at midnight. Pt should arrive at the [facility] entrance and go to admitting.  Per [place] in central scheduling need to fax OV note and fax [facility] lab order as well to [facility] radiology at [phone]

## 2019-01-10 ENCOUNTER — Telehealth: Payer: Self-pay

## 2019-01-10 ENCOUNTER — Other Ambulatory Visit: Payer: Self-pay | Admitting: Neurology

## 2019-01-10 NOTE — Anesthesia Postprocedure Evaluation (Signed)
Anesthesia Post Note  Patient: Margaret Farley  Procedure(s) Performed: MRI WITH ANESTHESIA BRAIN WITH AND WITHOUT CONTRAST (N/A )     Patient location during evaluation: PACU Anesthesia Type: General Level of consciousness: awake and alert Pain management: pain level controlled Vital Signs Assessment: post-procedure vital signs reviewed and stable Respiratory status: spontaneous breathing, nonlabored ventilation, respiratory function stable and patient connected to nasal cannula oxygen Cardiovascular status: blood pressure returned to baseline and stable Postop Assessment: no apparent nausea or vomiting Anesthetic complications: no    Last Vitals:  Vitals:   01/07/19 0619 01/07/19 0931  BP: 113/80 (!) 107/92  Pulse: 87 88  Resp: 18 12  Temp: 36.6 C (!) 36.4 C  SpO2: 98% 97%    Last Pain:  Vitals:   01/07/19 0931  TempSrc:   PainSc: 0-No pain                 HODIERNE,ADAM S

## 2019-01-10 NOTE — Telephone Encounter (Signed)
OV note, [facility] Requisition faxed to [facility]

## 2019-01-15 ENCOUNTER — Ambulatory Visit (HOSPITAL_COMMUNITY): Payer: Medicare Other

## 2019-01-23 ENCOUNTER — Telehealth: Payer: Self-pay | Admitting: Neurology

## 2019-01-23 ENCOUNTER — Ambulatory Visit (HOSPITAL_COMMUNITY)
Admission: RE | Admit: 2019-01-23 | Discharge: 2019-01-23 | Disposition: A | Discharge status: Home / Self Care | Payer: Medicare Other | Source: Ambulatory Visit | Attending: Neurology | Admitting: Neurology

## 2019-01-23 ENCOUNTER — Other Ambulatory Visit: Payer: Self-pay

## 2019-01-23 DIAGNOSIS — R569 Unspecified convulsions: Secondary | ICD-10-CM

## 2019-01-23 DIAGNOSIS — G40209 Localization-related (focal) (partial) symptomatic epilepsy and epileptic syndromes with complex partial seizures, not intractable, without status epilepticus: Secondary | ICD-10-CM | POA: Insufficient documentation

## 2019-01-23 LAB — CSF CELL COUNT WITH DIFFERENTIAL
RBC Count, CSF: 10 /mm3 — ABNORMAL HIGH
Tube #: 3
WBC, CSF: 2 /mm3 (ref 0–5)

## 2019-01-23 LAB — PROTEIN, CSF: Total  Protein, CSF: 46 mg/dL — ABNORMAL HIGH (ref 15–45)

## 2019-01-23 LAB — CRYPTOCOCCAL ANTIGEN, CSF: Crypto Ag: NEGATIVE

## 2019-01-23 LAB — GLUCOSE, CSF: Glucose, CSF: 56 mg/dL (ref 40–70)

## 2019-01-23 IMAGING — RF DG SPINAL PUNCT LUMBAR DIAG WITH FL CT GUIDANCE
1 series · 1 of 1 positions shown · non-contrast
Comparison: none

CLINICAL DATA: Seizures, convulsions

[Series 1: cp_standard · 0.25mm/px · 1 of 1 slices shown]
[im 1/1]
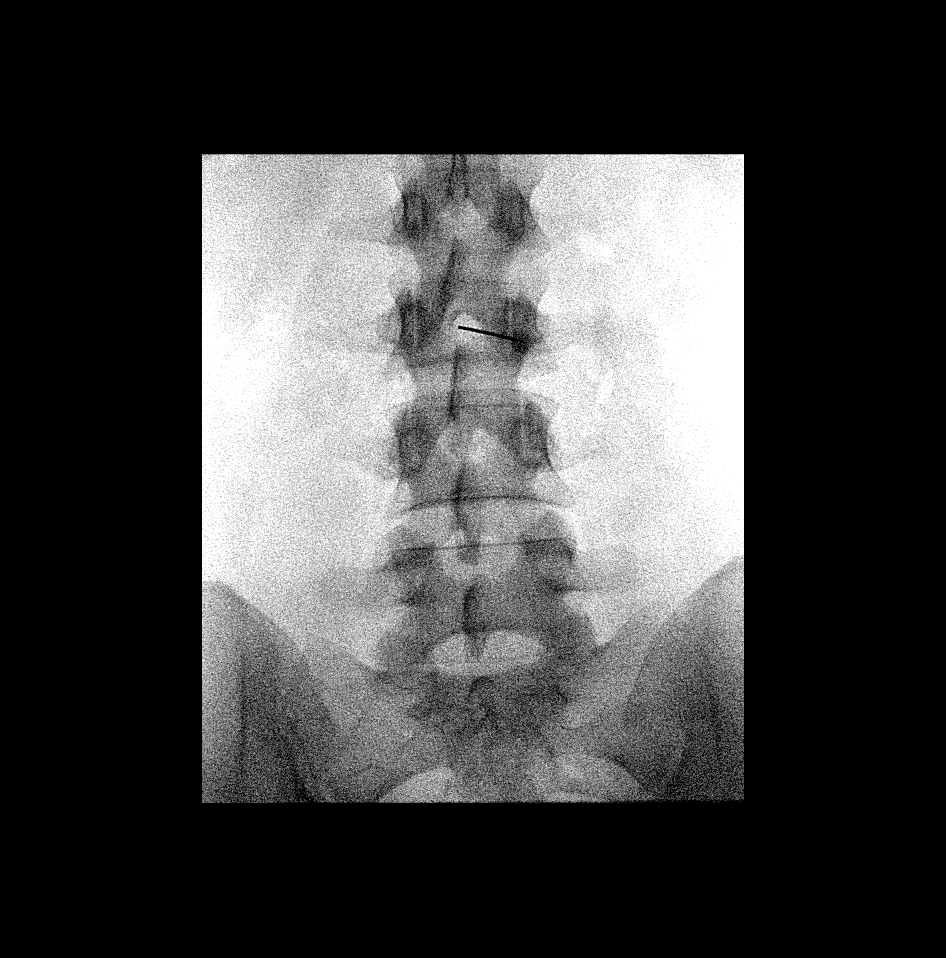

[1 of 1 positions shown; findings below may reference images not displayed]

EXAM:
DIAGNOSTIC LUMBAR PUNCTURE UNDER FLUOROSCOPIC GUIDANCE

FLUOROSCOPY TIME:  Fluoroscopy Time:  18 seconds

Radiation Exposure Index (if provided by the fluoroscopic device):
1.0 mGy

Number of Acquired Spot Images: None

PROCEDURE:
Informed consent was obtained from the patient prior to the
procedure, including potential complications of headache, allergy,
and pain. With the patient prone, the lower back was prepped with
Betadine. 1% Lidocaine was used for local anesthesia. Lumbar
puncture was performed at the L3-L4 level using a 20 gauge needle
with return of clear CSF with an opening pressure of 17 cm water. 11
ml of CSF was obtained for laboratory studies. The patient tolerated
the procedure well and there were no apparent complications.
IMPRESSION: Technically successful lumbar puncture without immediate
post-procedure complication. 11 mL of CSF obtained. Opening pressure
17 cm water.

## 2019-01-23 MED ORDER — LIDOCAINE HCL (PF) 1 % IJ SOLN
5.0000 mL | Freq: Once | INTRAMUSCULAR | Status: AC
  Administered 2019-01-23: 5 mL via INTRADERMAL

## 2019-01-23 NOTE — Telephone Encounter (Signed)
Per Hoyle Sauer ok to change lab to [facility] since are contract has been extended. Faxed [facility] Yolonda Kida to make her aware [phone]

## 2019-01-23 NOTE — Telephone Encounter (Signed)
Acquanetta Sit from [facility] is calling in about patient having lumbar puncture this morning and wanting it to go to [facility]. She said they can send it there and are needing to know what other lab to send to. Please call her back at [phone].thanks!

## 2019-01-23 NOTE — Discharge Instructions (Signed)
Lumbar Puncture, Care After °This sheet gives you information about how to care for yourself after your procedure. Your health care provider may also give you more specific instructions. If you have problems or questions, contact your health care provider. °What can I expect after the procedure? °After the procedure, it is common to have: °· Mild discomfort or pain at the puncture site. °· A mild headache that is relieved with pain medicines. °Follow these instructions at home: °Activity ° °· Lie down flat or rest for as long as directed by your health care provider. °· Return to your normal activities as told by your health care provider. Ask your health care provider what activities are safe for you. °· Avoid lifting anything heavier than 10 lb (4.5 kg) for at least 12 hours after the procedure. °· Do not drive for 24 hours if you were given a medicine to help you relax (sedative) during your procedure. °· Do not drive or use heavy machinery while taking prescription pain medicine. °Puncture site care °· Remove or change your bandage (dressing) as told by your health care provider. °· Check your puncture area every day for signs of infection. Check for: °? More pain. °? Redness or swelling. °? Fluid or blood leaking from the puncture site. °? Warmth. °? Pus or a bad smell. °General instructions °· Take over-the-counter and prescription medicines only as told by your health care provider. °· Drink enough fluids to keep your urine clear or pale yellow. Your health care provider may recommend drinking caffeine to prevent a headache. °· Keep all follow-up visits as told by your health care provider. This is important. °Contact a health care provider if: °· You have fever or chills. °· You have nausea or vomiting. °· You have a headache that lasts for more than 2 days or does not get better with medicine. °Get help right away if: °· You develop any of the following in your  legs: °? Weakness. °? Numbness. °? Tingling. °· You are unable to control when you urinate or have a bowel movement (incontinence). °· You have signs of infection around your puncture site, such as: °? More pain. °? Redness or swelling. °? Fluid or blood leakage. °? Warmth. °? Pus or a bad smell. °· You are dizzy or you feel like you might faint. °· You have a severe headache, especially when you sit or stand. °Summary °· A lumbar puncture is a procedure in which a small needle is inserted into the lower back to remove fluid that surrounds the brain and spinal cord. °· After this procedure, it is common to have a headache and pain around the needle insertion area. °· Lying flat, staying hydrated, and drinking caffeine can help prevent headaches. °· Monitor your needle insertion site for signs of infection, including warmth, fluid, or more pain. °· Get help right away if you develop leg weakness, leg numbness, incontinence, or severe headaches. °This information is not intended to replace advice given to you by your health care provider. Make sure you discuss any questions you have with your health care provider. °Document Released: 05/13/2013 Document Revised: 06/21/2016 Document Reviewed: 06/21/2016 °Elsevier Patient Education © 2020 [employer] ° °

## 2019-01-26 LAB — CSF CULTURE W GRAM STAIN: Culture: NO GROWTH

## 2019-01-30 ENCOUNTER — Telehealth: Payer: Self-pay | Admitting: Neurology

## 2019-01-30 NOTE — Telephone Encounter (Signed)
Patient's mom, Lovey Newcomer, called to see if the results of the lumbar puncture are back yet.

## 2019-01-31 NOTE — Telephone Encounter (Signed)
I see where the LP was ordered but not where it was completed.

## 2019-02-03 NOTE — Telephone Encounter (Signed)
There are notes in the chart under media. Will await for Dr. Delice Lesch to sign off and instruct.

## 2019-02-06 LAB — MISC LABCORP TEST (SEND OUT): Labcorp test code: 9985

## 2019-02-07 ENCOUNTER — Telehealth: Payer: Self-pay | Admitting: Neurology

## 2019-02-07 NOTE — Telephone Encounter (Signed)
Patient called regarding her daughters results. Please Call. Thanks

## 2019-02-11 MED ORDER — OXCARBAZEPINE 300 MG PO TABS: ORAL_TABLET | ORAL | 5 refills | Status: DC

## 2019-02-11 NOTE — Telephone Encounter (Signed)
Spoke to patient and mother about CSF results, negative for autoimmune panel. At this point, would continue working with Psychiatry on hallucinations. Mother asking about increasing oxcarbazepine dose to see if this helps with hallucinations, we can do a therapeutic trial, increase to 900mg  BID and see how she feels. F/u in 4-5 months

## 2019-02-11 NOTE — Telephone Encounter (Signed)
Ok to open up new pt slot for f/u on Jan 25 at 2pm, thanks

## 2019-02-21 LAB — FUNGUS CULTURE WITH STAIN

## 2019-02-21 LAB — FUNGAL ORGANISM REFLEX

## 2019-02-21 LAB — FUNGUS CULTURE RESULT

## 2019-02-26 ENCOUNTER — Other Ambulatory Visit: Payer: Self-pay | Admitting: Gastroenterology

## 2019-04-07 ENCOUNTER — Other Ambulatory Visit: Payer: Self-pay | Admitting: Family Medicine

## 2019-05-24 ENCOUNTER — Other Ambulatory Visit: Payer: Self-pay | Admitting: Gastroenterology

## 2019-06-16 ENCOUNTER — Telehealth: Payer: Medicare Other | Admitting: Neurology

## 2019-06-27 ENCOUNTER — Other Ambulatory Visit: Payer: Self-pay | Admitting: Gastroenterology

## 2019-06-27 NOTE — Telephone Encounter (Signed)
May have a refill until the time of an annual follow-up visit.  Visit may be with me or an app.  Thank you.

## 2019-07-24 ENCOUNTER — Other Ambulatory Visit: Payer: Self-pay | Admitting: Gastroenterology

## 2019-08-18 ENCOUNTER — Other Ambulatory Visit: Payer: Self-pay | Admitting: Gastroenterology

## 2019-09-01 ENCOUNTER — Other Ambulatory Visit: Payer: Self-pay | Admitting: Gastroenterology

## 2019-09-01 NOTE — Telephone Encounter (Signed)
May have refill until but needs to schedule follow-up with me. Thanks.

## 2019-09-07 ENCOUNTER — Other Ambulatory Visit: Payer: Self-pay | Admitting: Family Medicine

## 2019-09-13 ENCOUNTER — Other Ambulatory Visit: Payer: Self-pay | Admitting: Neurology

## 2019-09-30 ENCOUNTER — Other Ambulatory Visit: Payer: Self-pay | Admitting: Family Medicine

## 2019-10-06 ENCOUNTER — Telehealth: Payer: Self-pay | Admitting: Neurology

## 2019-10-06 NOTE — Telephone Encounter (Signed)
Patients mom called for advice. Patients psychiatrist suggests an ECT for the patient and mom wants to know if this is okay for her since she has seizures? Please call.

## 2019-10-06 NOTE — Telephone Encounter (Signed)
Please advise 

## 2019-10-07 NOTE — Telephone Encounter (Signed)
Pls let mom know that most patient with seizures can be treated with ECT, hopefully this helps her, thanks

## 2019-10-07 NOTE — Telephone Encounter (Signed)
Patient advised.

## 2019-10-07 NOTE — Telephone Encounter (Signed)
Pls let mom know that most patients with seizures can can be treated with ECT, hopefully this helps her, thanks

## 2019-10-25 ENCOUNTER — Other Ambulatory Visit: Payer: Self-pay | Admitting: Family Medicine

## 2019-10-27 NOTE — Telephone Encounter (Signed)
Patient scheduled for virtual visit for refill

## 2019-10-28 ENCOUNTER — Ambulatory Visit (INDEPENDENT_AMBULATORY_CARE_PROVIDER_SITE_OTHER): Payer: Medicare Other | Admitting: Family Medicine

## 2019-10-28 ENCOUNTER — Encounter: Payer: Self-pay | Admitting: Family Medicine

## 2019-10-28 DIAGNOSIS — M545 Low back pain: Secondary | ICD-10-CM | POA: Diagnosis not present

## 2019-10-28 DIAGNOSIS — G8929 Other chronic pain: Secondary | ICD-10-CM

## 2019-10-28 NOTE — Progress Notes (Signed)
Virtual Visit via Video Note  I connected with Maude Leriche on 10/28/19 at  4:15 PM EDT by a video enabled telemedicine application and verified that I am speaking with the correct person using two identifiers.  Alone on the phone  Location: Patient: At home setting change to phone interview secondary to trouble with the virtual platform Provider: In office   I discussed the limitations of evaluation and management by telemedicine and the availability of in person appointments. The patient expressed understanding and agreed to proceed.  History of Present Illness: 29 year old female with significant schizophrenia and seizure disorder with low back pain.  Patient states that her back pain has improved since she has been taking the meloxicam.  Recently has had a mild flare.  Patient states when she has a flare the meloxicam is helpful but patient has not had a refill in quite some time.  Patient denies any side effects to the medication    Observations/Objective: Alert talking on the phone.  Able to describe her back pain relatively well.   Assessment and Plan: 29 year old female with back pain chronic responding well to meloxicam.  Refill given today.   Follow Up Instructions: If no improvement follow-up in office in the next 6 to 8 weeks otherwise follow-up 1 year for medication refills    I discussed the assessment and treatment plan with the patient. The patient was provided an opportunity to ask questions and all were answered. The patient agreed with the plan and demonstrated an understanding of the instructions.   The patient was advised to call back or seek an in-person evaluation if the symptoms worsen or if the condition fails to improve as anticipated.  I provided 10 minutes of face-to-face time during this encounter.   Judi Saa, DO

## 2019-10-28 NOTE — Assessment & Plan Note (Signed)
Patient continues to have intermittent low back pain.  Has been responding fairly well to meloxicam when she needs it.  Has been a year since we have seen her.  No new symptoms.  Discussed icing regimen and home exercises though.  Patient refilled the meloxicam today.  Seems stable at the moment.  Follow-up again in 4 to 8 weeks if necessary otherwise can follow-up every year for prescription refills

## 2019-11-17 ENCOUNTER — Other Ambulatory Visit: Payer: Self-pay

## 2019-11-17 ENCOUNTER — Telehealth: Payer: Self-pay | Admitting: Neurology

## 2019-11-17 ENCOUNTER — Telehealth (INDEPENDENT_AMBULATORY_CARE_PROVIDER_SITE_OTHER): Payer: Medicare Other | Admitting: Psychiatry

## 2019-11-17 DIAGNOSIS — G40909 Epilepsy, unspecified, not intractable, without status epilepticus: Secondary | ICD-10-CM | POA: Diagnosis not present

## 2019-11-17 DIAGNOSIS — F209 Schizophrenia, unspecified: Secondary | ICD-10-CM | POA: Diagnosis not present

## 2019-11-17 NOTE — Telephone Encounter (Signed)
Patient's mother Andrey Campanile called in and stated her daughter has been feeling like she is having mini strokes and having a new symptom of double vision. Is there anything that can be done before her scheduled follow up on 03/16/20?

## 2019-11-17 NOTE — Telephone Encounter (Signed)
I contacted patient mother, patient has a history of seizures. Is having double vision x one-2 weeks, with dizziness, no less sleep, no alochol, no missed doses of meds. Please advise.

## 2019-11-17 NOTE — Progress Notes (Signed)
This is an ECT consult for this 29 year old woman with a history of chronic psychotic disorder.  Patient was referred by her outpatient psychiatrist Dr. Toy Care.  Patient was reached by telephone.  Her mother remained on the phone call throughout with the patient's consent.  Patient's identity was confirmed.  Reason for appointment was confirmed.  This is a 29 year old woman who has had a several year history of psychotic disorder.  Current symptoms include auditory hallucinations prominently throughout the day.  She describes them as voices of people she knew in the past and says they are constant.  They constantly say negative things about her and to her.  She believes that they are the result of a cell phone that has been inserted in her head.  She also believes they can hear her thoughts so that she can communicate back with them.  Mood is distressed constantly.  Mother reports that patient sleeps the majority of the day.  Patient denies any suicidal thoughts.  Denies any homicidal thoughts.  She states she is compliant with her current medication regimen which includes haloperidol Seroquel Xanax and Ambien as well as Trileptal which is taken for her seizure disorder.  Patient was evidently referred for consideration of ECT because of a failure to respond adequately to multiple other medicines.  Patient reports she has tried 11 other antipsychotics in the past.  Mother reports that the patient has been prescribed clozapine in the past and did not tolerate it.  Several prior hospitalizations.  Denies however any history of suicide attempts.  States when she first had symptoms Risperdal was helpful but it lost its effectiveness and no medicine has worked adequately since then.  Multiple medication trials.  Mother states the clozapine was tried briefly but that the patient became dizzy and perhaps dissociative on it which is the reason she recalls it being stopped.  No history of violence to others.  Medical  history: Patient also has a seizure disorder diagnosis.  Somewhat confusing.  Evidently she had 1 seizure that phenotypically appeared like a typical grand mal seizure years ago.  She has had extensive work-up since then with no definite result but is maintained on anticonvulsant medicine.  No other active medical problems.  Patient denies any alcohol or drug abuse  Patient lives at home with her mother.  Unable to work.  Mental status exam: Patient sounded somewhat slow on the phone.  Not completely deferential to her mother but mother asked the majority of the questions.  Patient did appear to understand the nature of the treatment and the point of the conversation.  She asked appropriate questions.  Denies active suicidal intent.  Endorses psychotic symptoms.  Did appear however able to attend and converse normally during the interview.  Alert and oriented.  Patient clearly stated at the beginning and end of the interview that she did not want to have ECT.  This is a 29 year old woman with a diagnosis of schizophrenia possibly schizoaffective disorder who has primary symptoms of auditory hallucinations and delusional beliefs.  She also has a history of a seizure disorder and is maintained on anticonvulsant medicines.  As part of her medicine regimen she is on 6 mg a day of Xanax.  Additionally, the patient unambiguously stated that she did not want to have ECT but was only agreeing to have the interview because she had been instructed to do so.  I advised the patient that while schizophrenia could at times be an indication for ECT it was  not the primary indication.  The chances of improvement in schizophrenia are comparatively low and poorly defined.  Consequently I told her I could not give her any assurance that treatment would be of any benefit to her.  Side effects were discussed including memory impairment confusion headache heart arrhythmia and nausea.  Patient made it clear that she did not want  to pursue ECT at this time.  The mother remained on the telephone and gave the impression to me that she was in favor of the treatment largely because she felt that nothing else was helping the patient to get better.  This was acknowledged and I told the patient I would be happy to continue discussing it with her as she considered her options.  We left the situation with the patient having a phone number of my office and being instructed to call if symptoms change or for thoughts about ECT change or if he has any new questions.  No recommendations were made about any change of medicine

## 2019-11-18 NOTE — Telephone Encounter (Signed)
Patient scheduled for earlier visit on 7/1 at 4pm to address concerns.

## 2019-11-20 ENCOUNTER — Encounter: Payer: Self-pay | Admitting: Neurology

## 2019-11-20 ENCOUNTER — Other Ambulatory Visit: Payer: Self-pay

## 2019-11-20 ENCOUNTER — Telehealth (INDEPENDENT_AMBULATORY_CARE_PROVIDER_SITE_OTHER): Payer: Medicare Other | Admitting: Neurology

## 2019-11-20 VITALS — Ht 63.0 in | Wt 165.0 lb

## 2019-11-20 DIAGNOSIS — F200 Paranoid schizophrenia: Secondary | ICD-10-CM | POA: Diagnosis not present

## 2019-11-20 DIAGNOSIS — Z79899 Other long term (current) drug therapy: Secondary | ICD-10-CM | POA: Diagnosis not present

## 2019-11-20 DIAGNOSIS — H532 Diplopia: Secondary | ICD-10-CM

## 2019-11-20 DIAGNOSIS — G40209 Localization-related (focal) (partial) symptomatic epilepsy and epileptic syndromes with complex partial seizures, not intractable, without status epilepticus: Secondary | ICD-10-CM | POA: Diagnosis not present
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
## 2019-11-21 ENCOUNTER — Telehealth: Payer: Self-pay

## 2019-11-21 NOTE — Telephone Encounter (Signed)
-----   Message from Van Clines, MD sent at 11/20/2019  4:54 PM EDT ----- Regarding: oxcarbazepine level Pls order oxcarbazepine level and let mother know where to go and to have drawn first thing in AM before she takes her Trileptal dose. Thanks!  Clydie Braun

## 2019-11-21 NOTE — Telephone Encounter (Signed)
Spoke with pt mother lab placed in epic, pt mother will bring pt next week to have lab work done,

## 2019-12-01 ENCOUNTER — Telehealth: Payer: Self-pay | Admitting: Neurology

## 2019-12-01 NOTE — Telephone Encounter (Signed)
Spoke with staff at [facility] and requested notes from last weeks OV, she will fax to [phone].

## 2019-12-01 NOTE — Telephone Encounter (Signed)
Patients mother states patient is supposed to have an MRI ordered but wanted Dr. Karel Jarvis to know she will need a sedated one because patient is severely claustrophobic. She is also requesting for them to check if patient may have schizophrenia.

## 2019-12-01 NOTE — Telephone Encounter (Signed)
Pls let mother know I spoke to Dr. Allena Katz and will get MRI under sedation ordered, but also let mother know that MRI does not diagnose schizophrenia, it is a clinical diagnosis by a Psychiatrist. Gunnar Fusi, pls call her eye doctor's office to request a copy of the visit notes from last Friday, will need it to order the MRI. Thanks

## 2019-12-01 NOTE — Telephone Encounter (Signed)
Called again to request eye exam notes be faxed.

## 2019-12-01 NOTE — Telephone Encounter (Signed)
Pts mother called requesting "sedation" for her daughters MRI because she is claustrophobic. Also questioning schizophrenia. Note on DPR that pt has not signed to allow communication with anyone but herself.

## 2019-12-02 ENCOUNTER — Other Ambulatory Visit: Payer: Self-pay

## 2019-12-02 DIAGNOSIS — H5010 Unspecified exotropia: Secondary | ICD-10-CM

## 2019-12-02 DIAGNOSIS — H5021 Vertical strabismus, right eye: Secondary | ICD-10-CM

## 2019-12-02 DIAGNOSIS — H532 Diplopia: Secondary | ICD-10-CM

## 2019-12-02 NOTE — Telephone Encounter (Signed)
Margaret Farley at  [facility] scheduling called no answer voice mail left waiting for return call to get pt MRI's scheduled at [facility]

## 2019-12-02 NOTE — Telephone Encounter (Signed)
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 Recommended MRI brain and orbits with and without contrast to be ordered by Neurology as their office does not order scans. She will need MRI under sedation.  Heather, pls order MRI brain with and without contrast and MRI orbits with and without contrast under sedation, Dx: diplopia, hypertropia, extropia. Thanks!

## 2019-12-02 NOTE — Telephone Encounter (Signed)
Requested OV notes from [facility] be faxed to [phone] attn Gunnar Fusi

## 2019-12-02 NOTE — Telephone Encounter (Signed)
Order placed in Epic for both MRI's  waiting for PA's then will call and get scheduled,

## 2019-12-03 NOTE — Telephone Encounter (Signed)
Pt mother Andrey Campanile called [facility] scheduled July 27th at 8 am pt has to be there at 6 am NPO at midnight, she has to have a driver, pt has to have a covid test [facility] mins early. Pt mother verbalized understanding

## 2019-12-12 ENCOUNTER — Encounter (HOSPITAL_COMMUNITY): Payer: Self-pay | Admitting: *Deleted

## 2019-12-12 NOTE — Progress Notes (Addendum)
PCP - Dr Vassie Moselle Cardiologist - n/a Internal Med - Antoine Primas, DO Neuro - Dr Patrcia Dolly  Chest x-ray - n/a EKG - n/a Stress Test - n/a ECHO - n/a Cardiac Cath - n/a  Anesthesia review: Yes.  Spoke with Revonda Standard, [place] about patient's medications.   STOP now taking any Aspirin (unless otherwise instructed by your surgeon), Aleve, Naproxen, Ibuprofen, Motrin, Advil, Goody's, BC's, all herbal medications, fish oil, and all vitamins.   Coronavirus Screening Covid test on 12/13/19 was negative.  Spoke with patient's Mother Pricilla Moehle [phone].  Sandy verbalized understanding of instructions that were given via phone.

## 2019-12-13 ENCOUNTER — Other Ambulatory Visit (HOSPITAL_COMMUNITY)
Admission: RE | Admit: 2019-12-13 | Discharge: 2019-12-13 | Disposition: A | Discharge status: Home / Self Care | Payer: Medicare Other | Source: Ambulatory Visit | Attending: Neurology | Admitting: Neurology

## 2019-12-13 DIAGNOSIS — Z01812 Encounter for preprocedural laboratory examination: Secondary | ICD-10-CM | POA: Insufficient documentation

## 2019-12-13 DIAGNOSIS — Z20822 Contact with and (suspected) exposure to covid-19: Secondary | ICD-10-CM | POA: Diagnosis not present

## 2019-12-13 LAB — SARS CORONAVIRUS 2 (TAT 6-24 HRS): SARS Coronavirus 2: NEGATIVE

## 2019-12-15 ENCOUNTER — Encounter (HOSPITAL_COMMUNITY): Payer: Self-pay | Admitting: *Deleted

## 2019-12-15 NOTE — Anesthesia Preprocedure Evaluation (Addendum)
Anesthesia Evaluation  Patient identified by MRN, date of birth, ID band Patient awake    Reviewed: Allergy & Precautions, H&P , NPO status , Patient's Chart, lab work & pertinent test results  Airway Mallampati: II   Neck ROM: full    Dental   Pulmonary former smoker,    breath sounds clear to auscultation       Cardiovascular negative cardio ROS   Rhythm:regular Rate:Normal     Neuro/Psych Seizures -,  PSYCHIATRIC DISORDERS Anxiety Depression Schizophrenia    GI/Hepatic GERD  ,  Endo/Other    Renal/GU      Musculoskeletal   Abdominal   Peds  Hematology   Anesthesia Other Findings   Reproductive/Obstetrics                             Anesthesia Physical Anesthesia Plan  ASA: II  Anesthesia Plan: General   Post-op Pain Management:    Induction: Intravenous  PONV Risk Score and Plan: 3 and Ondansetron, Dexamethasone, Midazolam and Treatment may vary due to age or medical condition  Airway Management Planned: Oral ETT  Additional Equipment:   Intra-op Plan:   Post-operative Plan: Extubation in OR  Informed Consent: I have reviewed the patients History and Physical, chart, labs and discussed the procedure including the risks, benefits and alternatives for the proposed anesthesia with the patient or authorized representative who has indicated his/her understanding and acceptance.       Plan Discussed with: CRNA, Anesthesiologist and Surgeon  Anesthesia Plan Comments: (PAT note written 12/15/2019 by Shonna Chock, PA-C. )       Anesthesia Quick Evaluation

## 2019-12-15 NOTE — Progress Notes (Signed)
Anesthesia Chart Review:  Case: [id number] Date/Time: 12/16/19 0745   Procedure: MRI WITH ANESTHESIA OF BRAIN WITH AND WITHOUT CONTRAST, MRI OF ORBITS WITH AND WITHOUT CONTRAST (N/A )   Anesthesia type: General   Pre-op diagnosis: DIPLOPIA, HYPERTROPIA RIGHT EYE, EXOTROPIA RIGHT EYE   [facility] / [facility] OR   Surgeons: Radiologist, Medication, MD      DISCUSSION: Patient is a 29 year old female scheduled for the above procedure. MRI ordered by neurologist Patrcia Dolly, MD after communication with provider at [facility] for diplopia post-dilation with Manifest refraction, right hypertropia/exotropia. Office visit/H&P done on 11/20/19 by Dr. Karel Jarvis.  History includes former smoker, Schizophrenia (with history of paranoia, auditory hallucinations), ADD, anxiety, depression, HLD, GERD, ovarian cyst, seizures (first in 2012).  She is for labs including pregnancy test on the day of surgery. 12/13/19 pre-procedure COVID test negative. Anesthesia team to evaluate on the day of procedure.    PROVIDERS: Seen by Madelyn Flavors, MD on 07/28/19 for physical exam ([facility]) Patrcia Dolly, MD is neurologist   LABS: For day of procedure. She has comparison labs from 07/28/19 in [facility].   OTHER: Ambulatory EEG 10/21/18: IMPRESSION:This 48-hour ambulatoryvideoEEG study isabnormaldue to an episode of rhythmic left temporal theta activity concerning for electrographic seizure. CLINICAL CORRELATION: There was one episode lasting 1 minute concerning for an electrographic seizure arising from the left temporal region. Patient is not on video and movement artifact is still a possibility. Episodes where auditory hallucinations were worse did not show epileptiform correlate.If further clinical questions remain, inpatient video EEG monitoring may be helpful.   IMAGES: MRI Brain 01/07/19: IMPRESSION: - Stable appearance of the brain as  compared to MRI 12/18/2014. - No evidence intracranial mass or acute intracranial abnormality. No seizure focus is identified.   EKG: EKG 07/25/17: Sinus tachycardia at 104 bpm Possible Left atrial enlargement Incomplete right bundle branch block Nonspecific T wave abnormality Abnormal ECG No old tracing to compare Confirmed by Orpah Cobb ([id number]) on 07/26/2017 9:18:14 AM   CV: N/A   Past Medical History:  Diagnosis Date  . ADD (attention deficit disorder)   . Allergic rhinitis   . Anxiety   . Depression   . GERD (gastroesophageal reflux disease)    diet controlled, otc med prn  . HLD (hyperlipidemia)    diet controlled, no med  . Ovarian cyst    HX  . Schizophrenia (HCC)   . Seizure [facility])    last one 08/19/2018    Past Surgical History:  Procedure Laterality Date  . MRI     sedation at [facility]  . RADIOLOGY WITH ANESTHESIA N/A 01/07/2019   Procedure: MRI WITH ANESTHESIA BRAIN WITH AND WITHOUT CONTRAST;  Surgeon: Radiologist, Medication, MD;  Location: [facility] OR;  Service: Radiology;  Laterality: N/A;    MEDICATIONS: No current facility-administered medications for this encounter.   Marland Kitchen alprazolam (XANAX) 2 MG tablet  . haloperidol (HALDOL) 10 MG tablet  . MELATONIN PO  . Oxcarbazepine (TRILEPTAL) 300 MG tablet  . oxymetazoline (AFRIN) 0.05 % nasal spray  . Phenylephrine-DM-GG-APAP (TYLENOL COLD/FLU SEVERE PO)  . QUEtiapine (SEROQUEL) 100 MG tablet  . zolpidem (AMBIEN) 10 MG tablet  . meloxicam (MOBIC) 15 MG tablet  . pantoprazole (PROTONIX) 40 MG tablet     Shonna Chock, PA-C Surgical Short Stay/Anesthesiology [facility] Phone [phone] [facility] Phone [phone] 12/15/2019 11:01 AM

## 2019-12-16 ENCOUNTER — Encounter (HOSPITAL_COMMUNITY): Payer: Self-pay | Admitting: Neurology

## 2019-12-16 ENCOUNTER — Ambulatory Visit (HOSPITAL_COMMUNITY): Payer: Medicare Other | Admitting: Vascular Surgery

## 2019-12-16 ENCOUNTER — Ambulatory Visit (HOSPITAL_COMMUNITY)
Admission: RE | Admit: 2019-12-16 | Discharge: 2019-12-16 | Disposition: A | Discharge status: Home / Self Care | Payer: Medicare Other | Source: Ambulatory Visit | Attending: Neurology | Admitting: Neurology

## 2019-12-16 ENCOUNTER — Telehealth: Payer: Self-pay

## 2019-12-16 ENCOUNTER — Encounter (HOSPITAL_COMMUNITY)
Admission: RE | Disposition: A | Discharge status: Home / Self Care | Payer: Self-pay | Source: Ambulatory Visit | Attending: Neurology

## 2019-12-16 ENCOUNTER — Other Ambulatory Visit: Payer: Self-pay

## 2019-12-16 DIAGNOSIS — Z888 Allergy status to other drugs, medicaments and biological substances status: Secondary | ICD-10-CM | POA: Diagnosis not present

## 2019-12-16 DIAGNOSIS — F200 Paranoid schizophrenia: Secondary | ICD-10-CM | POA: Insufficient documentation

## 2019-12-16 DIAGNOSIS — H532 Diplopia: Secondary | ICD-10-CM | POA: Insufficient documentation

## 2019-12-16 DIAGNOSIS — G40909 Epilepsy, unspecified, not intractable, without status epilepticus: Secondary | ICD-10-CM | POA: Diagnosis not present

## 2019-12-16 DIAGNOSIS — H5021 Vertical strabismus, right eye: Secondary | ICD-10-CM | POA: Insufficient documentation

## 2019-12-16 DIAGNOSIS — H5010 Unspecified exotropia: Secondary | ICD-10-CM

## 2019-12-16 DIAGNOSIS — Z79899 Other long term (current) drug therapy: Secondary | ICD-10-CM | POA: Insufficient documentation

## 2019-12-16 DIAGNOSIS — R42 Dizziness and giddiness: Secondary | ICD-10-CM | POA: Diagnosis not present

## 2019-12-16 HISTORY — DX: Hyperlipidemia, unspecified: E78.5

## 2019-12-16 HISTORY — DX: Gastro-esophageal reflux disease without esophagitis: K21.9

## 2019-12-16 HISTORY — DX: Unspecified ovarian cyst, unspecified side: N83.209

## 2019-12-16 HISTORY — PX: RADIOLOGY WITH ANESTHESIA: SHX6223

## 2019-12-16 LAB — POCT PREGNANCY, URINE: Preg Test, Ur: NEGATIVE

## 2019-12-16 IMAGING — MR MR ORBITS WO/W CM
5 of 7 series · 28 of 48 positions shown · IV contrast (gadavist)
Comparison: [DATE]

CLINICAL DATA: Seizures. Diplopia. Right hypertropia/exotropia.
Abnormal EEG with seizure arising from the left temporal region.

EXAM:
MRI HEAD AND ORBITS WITHOUT AND WITH CONTRAST
TECHNIQUE: Multiplanar, multiecho pulse sequences of the brain and surrounding
structures were obtained without and with intravenous contrast.
Multiplanar, multiecho pulse sequences of the orbits and surrounding
structures were obtained including fat saturation techniques, before
and after intravenous contrast administration.
CONTRAST:  7mL GADAVIST GADOBUTROL 1 MMOL/ML IV SOLN

[Series 15: T1 · axial · non-contrast · 3.0mm · 0.37mm/px · z∈[-101,-55]mm · 6 of 12 slices shown (1 of 2)]
[im 1/12]
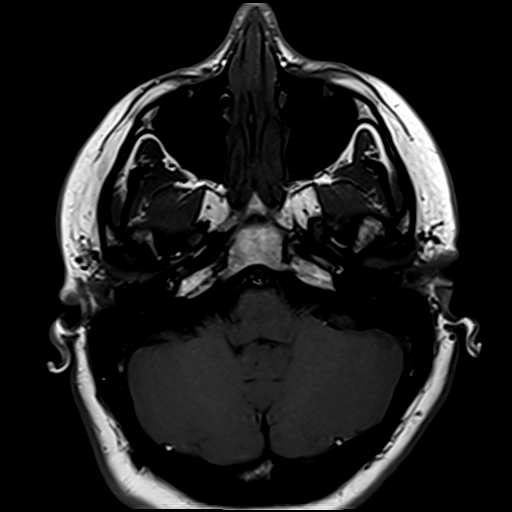
[im 3/12]
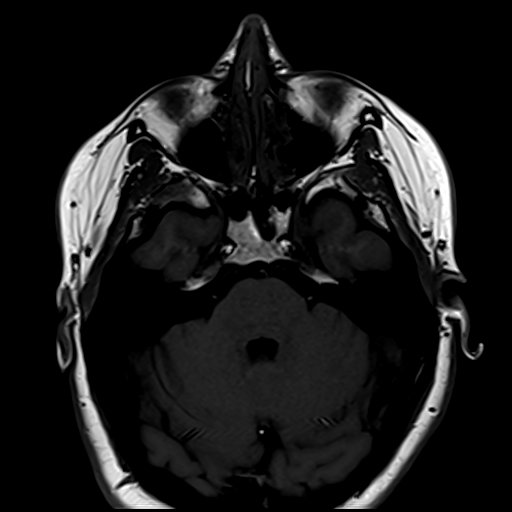
[im 5/12]
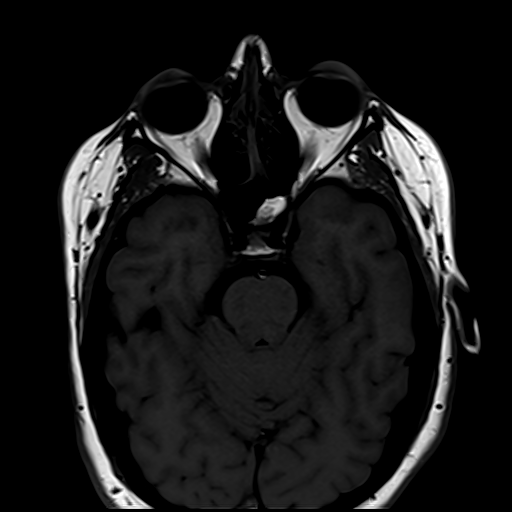
[im 7/12]
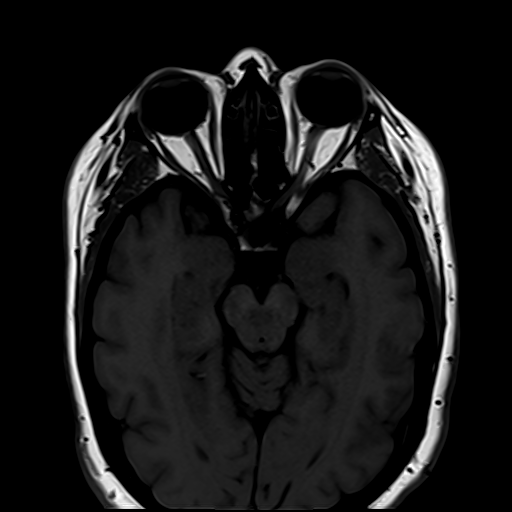
[im 9/12]
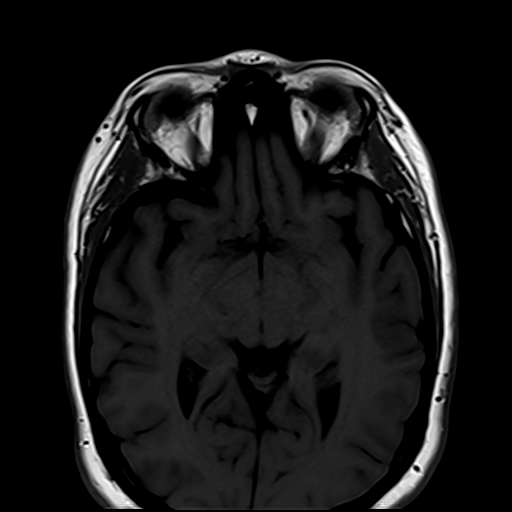
[im 12/12]
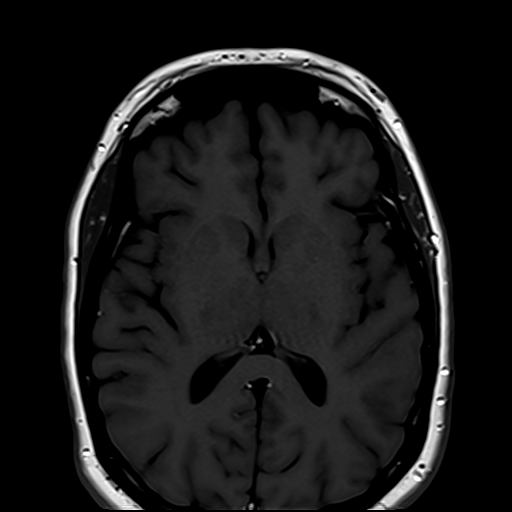

[Series 16: T2 fat-sat · axial · 3.0mm · 0.54mm/px · z∈[-101,-55]mm · 5 of 12 slices shown (1 of 3)]
[im 1/12]
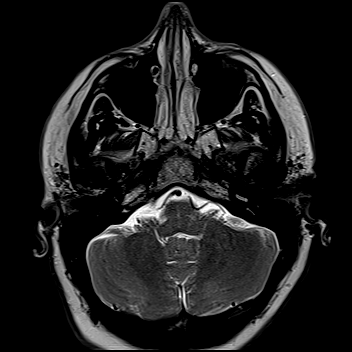
[im 3/12]
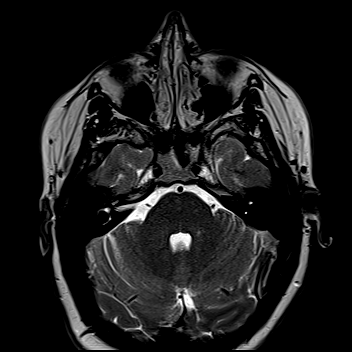
[im 6/12]
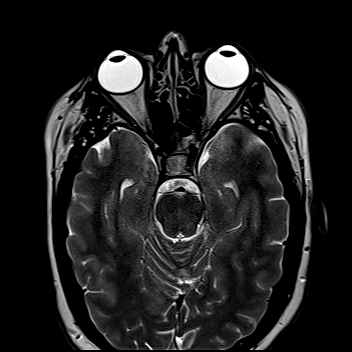
[im 9/12]
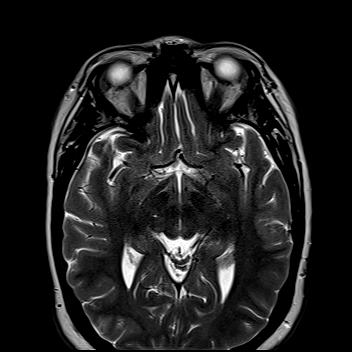
[im 12/12]
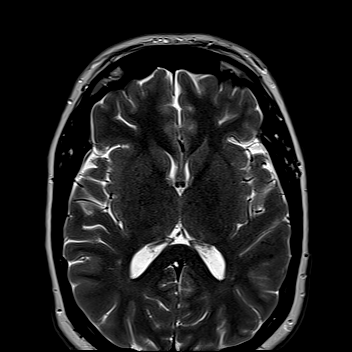

[Series 18: T2 fat-sat · axial · 3.0mm · 0.54mm/px · z∈[-101,-55]mm · 5 of 12 slices shown (2 of 3)]
[im 1/12]
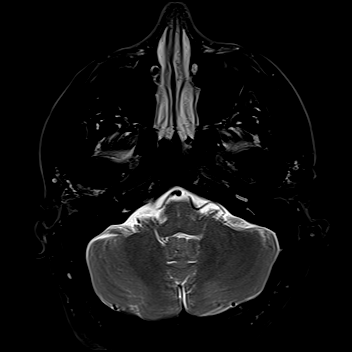
[im 3/12]
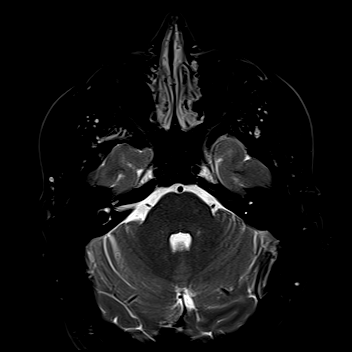
[im 6/12]
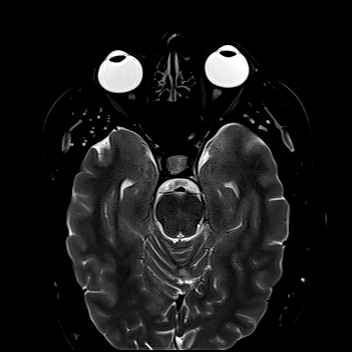
[im 9/12]
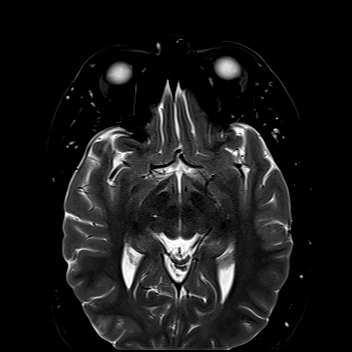
[im 12/12]
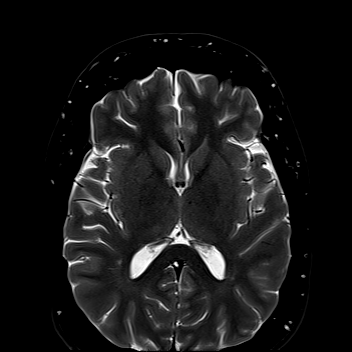

[Series 21: T2 fat-sat · coronal · 3.0mm · 0.54mm/px · 9 of 20 slices shown (3 of 3)]
[im 1/20]
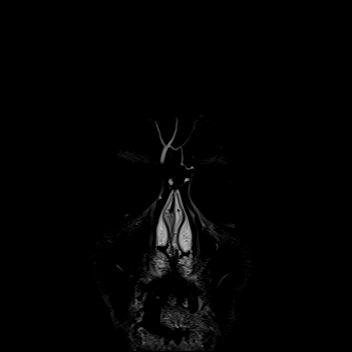
[im 3/20]
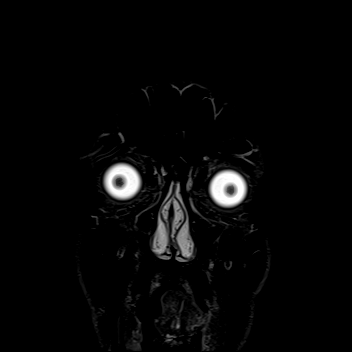
[im 5/20]
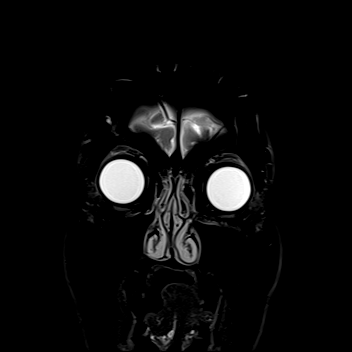
[im 8/20]
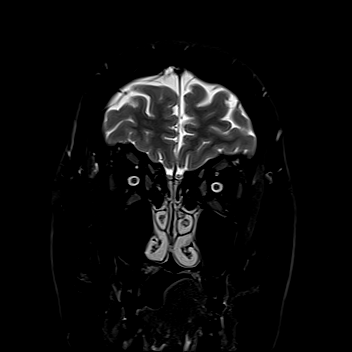
[im 10/20]
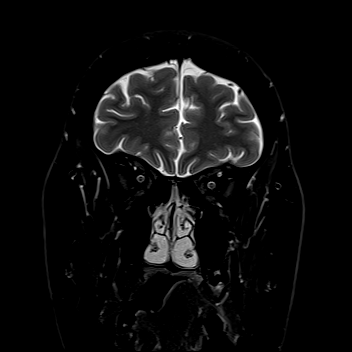
[im 12/20]
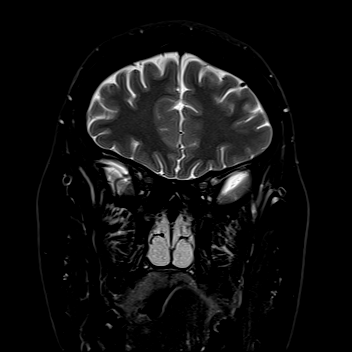
[im 15/20]
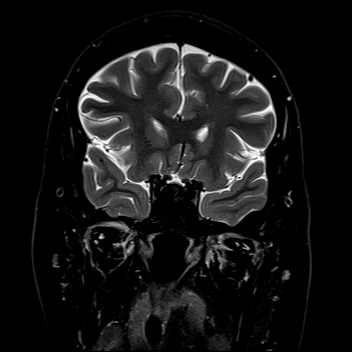
[im 17/20]
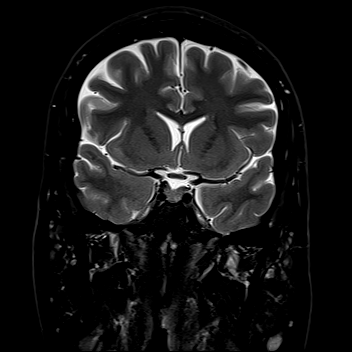
[im 20/20]
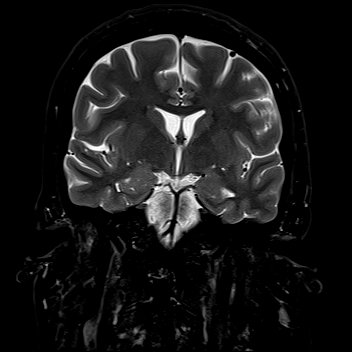

[Series 22: T1 · coronal · 3.0mm · 0.37mm/px · 3 of 20 slices shown (2 of 2)]
[im 1/20]
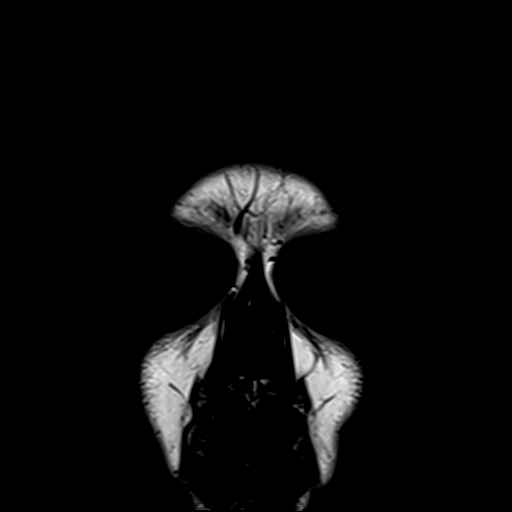
[im 3/20]
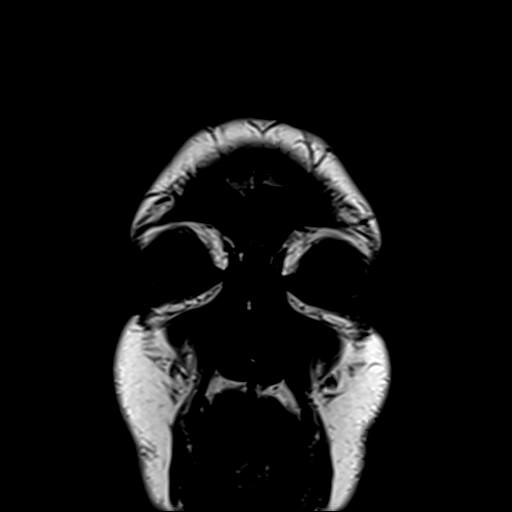
[im 5/20]
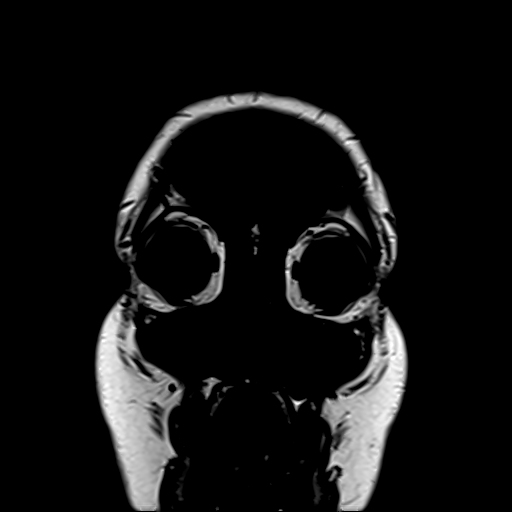

[28 of 48 positions shown; findings below may reference images not displayed]

FINDINGS: The study was performed under general anesthesia.

MRI HEAD FINDINGS

Brain: There is no evidence of acute infarct, intracranial
hemorrhage, mass, midline shift, or extra-axial fluid collection.
The ventricles and sulci are normal. Several punctate foci of T2
hyperintensity in the bilateral periatrial white matter are
unchanged and nonspecific. No abnormal enhancement is identified.
Minimal cerebellar tonsillar ectopia of 2 mm is unchanged. Dedicated
thin section imaging through the temporal lobes demonstrates normal
volume and signal of the hippocampi. There is no evidence of
heterotopia or cortical dysplasia.

Vascular: Major intracranial vascular flow voids are preserved.

Skull and upper cervical spine: Unremarkable bone marrow signal.

Other: None.

MRI ORBITS FINDINGS

Orbits: The globes appear intact. The optic nerves are symmetric in
size and normal in signal without abnormal enhancement. No orbital
mass or inflammation is identified. The extraocular muscles and
lacrimal glands are unremarkable.

Visualized sinuses: Paranasal sinuses and mastoid air cells are
clear.

Soft tissues: Unremarkable.

Limited intracranial: Unremarkable appearance of the optic chiasm,
cavernous sinuses, and pituitary gland.
IMPRESSION: 1. Unchanged and largely unremarkable appearance of the brain. No
acute intracranial abnormality, mass, or etiology of seizures
identified.
2. Unremarkable MRI of the orbits.

## 2019-12-16 IMAGING — MR MR HEAD WO/W CM
11 of 14 series · 34 of 48 positions shown · IV contrast (gadavist)
Comparison: [DATE]

CLINICAL DATA: Seizures. Diplopia. Right hypertropia/exotropia.
Abnormal EEG with seizure arising from the left temporal region.

EXAM:
MRI HEAD AND ORBITS WITHOUT AND WITH CONTRAST
TECHNIQUE: Multiplanar, multiecho pulse sequences of the brain and surrounding
structures were obtained without and with intravenous contrast.
Multiplanar, multiecho pulse sequences of the orbits and surrounding
structures were obtained including fat saturation techniques, before
and after intravenous contrast administration.
CONTRAST:  7mL GADAVIST GADOBUTROL 1 MMOL/ML IV SOLN

[Series 5: DWI · axial · 3.0mm · 0.88mm/px · z∈[-110,+30]mm · 8 of 96 slices shown (1 of 2)]
[im 1/96]
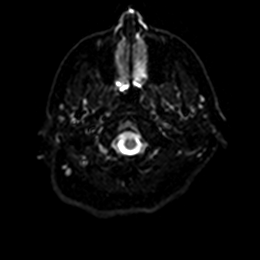
[im 14/96]
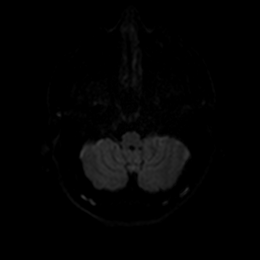
[im 28/96]
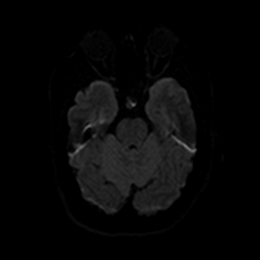
[im 41/96]
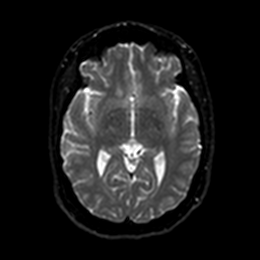
[im 55/96]
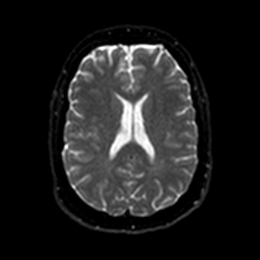
[im 68/96]
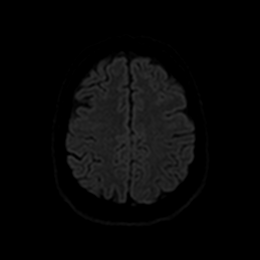
[im 82/96]
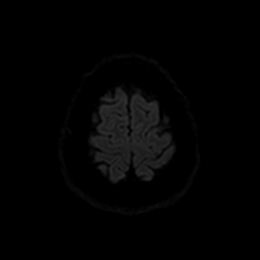
[im 96/96]
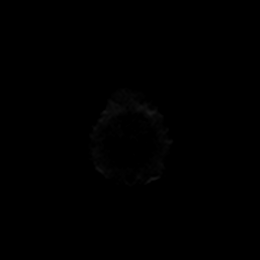

[Series 6: DWI · axial · 3.0mm · 0.88mm/px · z∈[-110,+30]mm · 4 of 48 slices shown (2 of 2)]
[im 1/48]
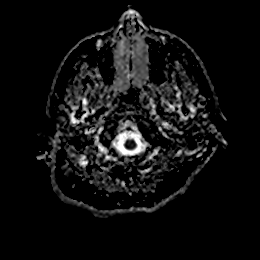
[im 16/48]
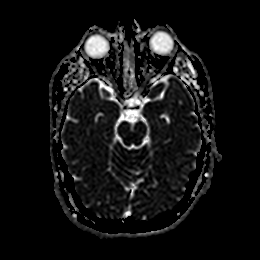
[im 32/48]
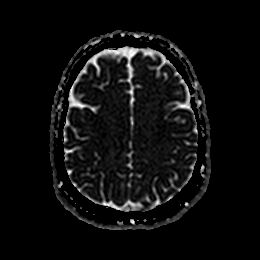
[im 48/48]
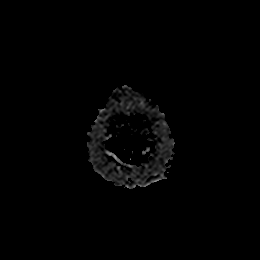

[Series 7: T1 · sagittal · 5.0mm · 0.72mm/px · 2 of 22 slices shown]
[im 1/22]
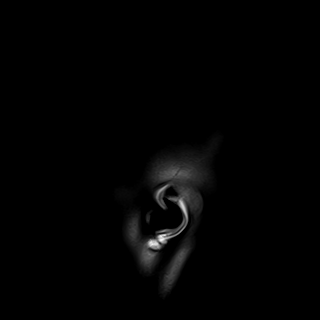
[im 22/22]
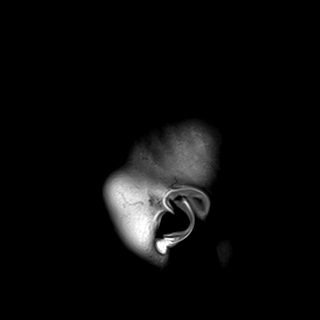

[Series 8: T2 · axial · 4.0mm · 0.72mm/px · z∈[-109,+41]mm · 3 of 31 slices shown (1 of 3)]
[im 1/31]
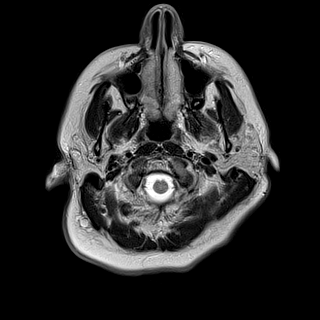
[im 16/31]
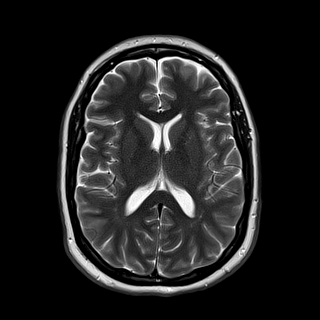
[im 31/31]
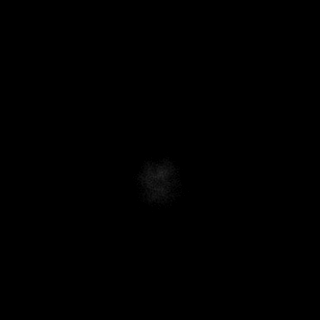

[Series 9: mag_images · axial · 3.0mm · 0.90mm/px · z∈[-110,+31]mm · 4 of 48 slices shown]
[im 1/48]
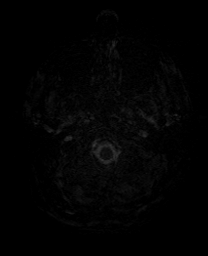
[im 16/48]
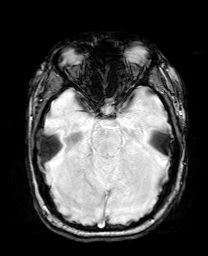
[im 32/48]
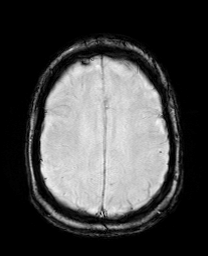
[im 48/48]
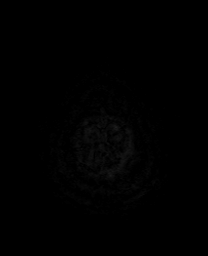

[Series 10: pha_images · axial · 3.0mm · 0.90mm/px · z∈[-110,-65]mm · 2 of 47 slices shown]
[im 1/47]
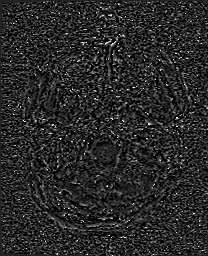
[im 16/47]
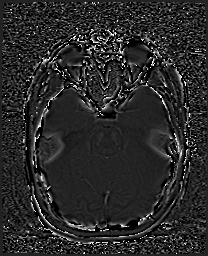

[Series 13: FLAIR · axial · 3.0mm · 0.45mm/px · z∈[-108,+31]mm · 3 of 40 slices shown (1 of 2)]
[im 1/40]
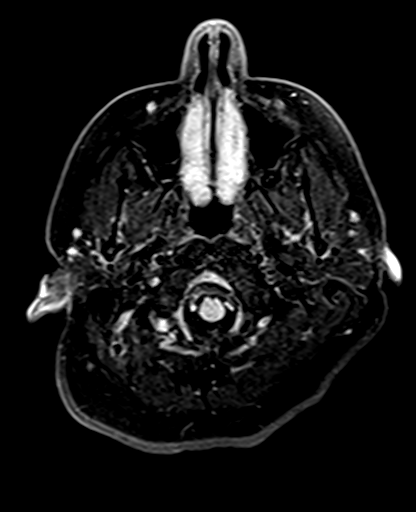
[im 20/40]
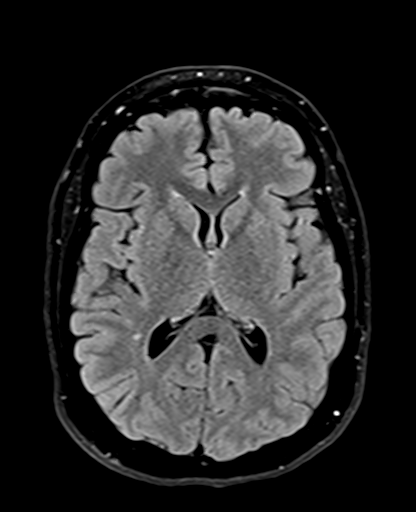
[im 40/40]
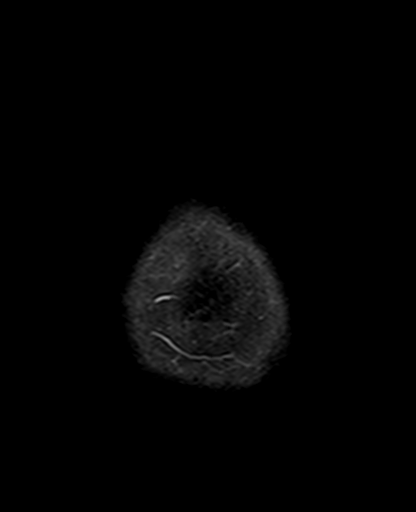

[Series 23: T2 · coronal · 3.0mm · 0.27mm/px · 2 of 30 slices shown (2 of 3)]
[im 1/30]
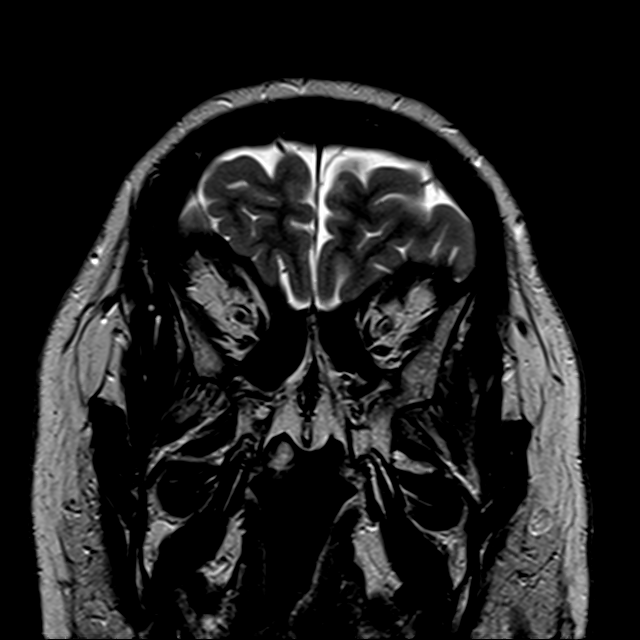
[im 30/30]
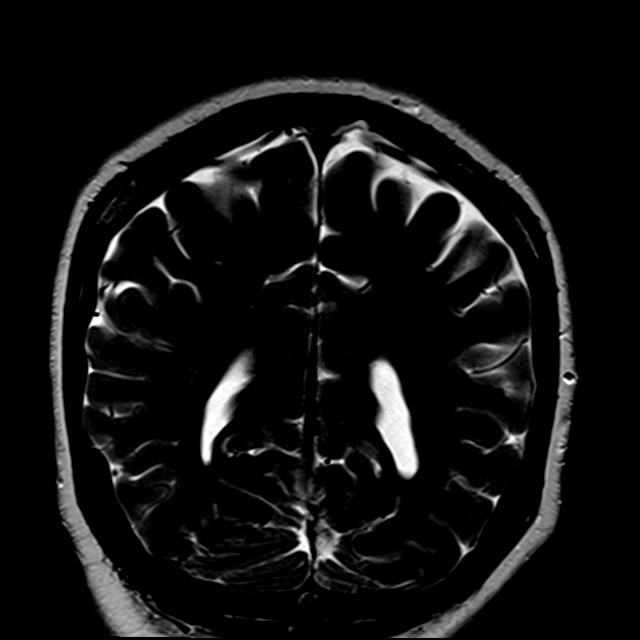

[Series 24: FLAIR · coronal · 3.0mm · 0.56mm/px · 2 of 30 slices shown (2 of 2)]
[im 1/30]
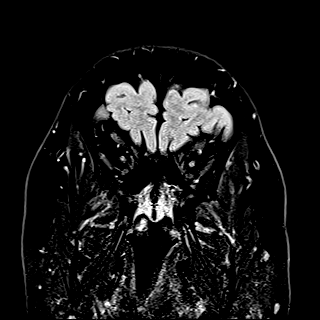
[im 30/30]
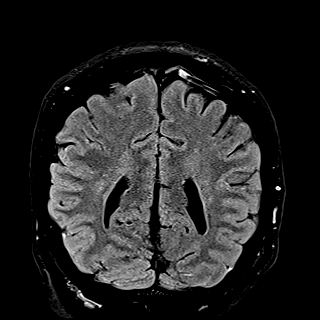

[Series 25: T2 · coronal · 5.0mm · 0.34mm/px · 2 of 29 slices shown (3 of 3)]
[im 1/29]
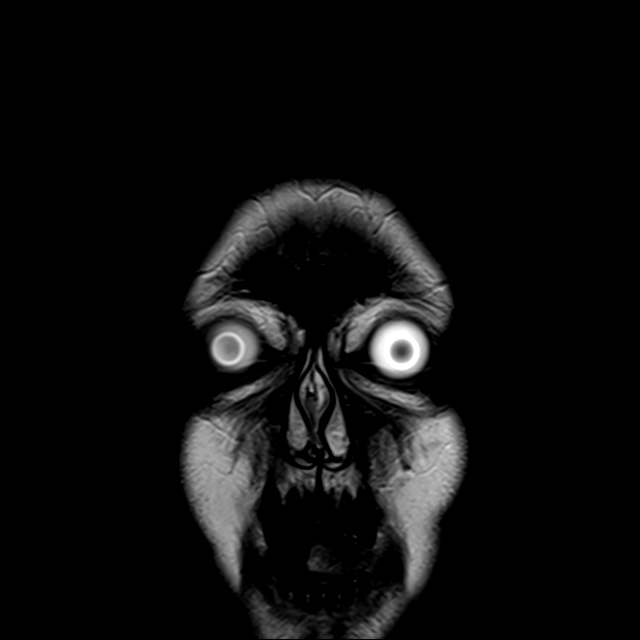
[im 29/29]
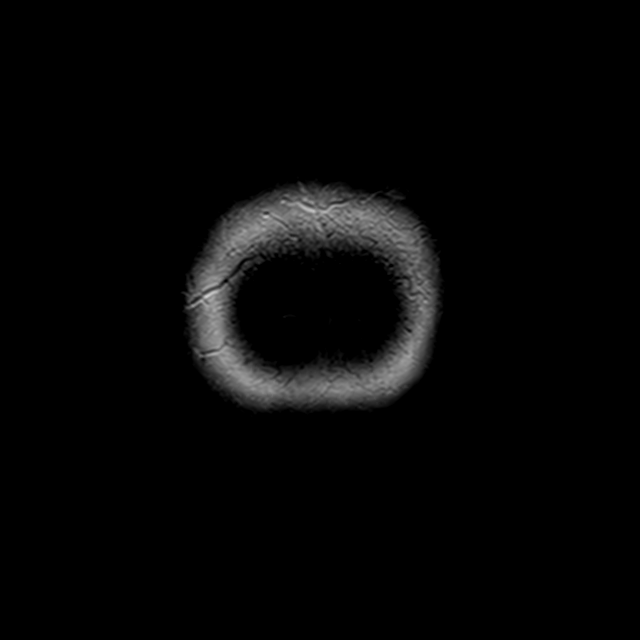

[Series 29: T1 post-contrast · coronal · 5.0mm · 0.34mm/px · 2 of 29 slices shown]
[im 1/29]
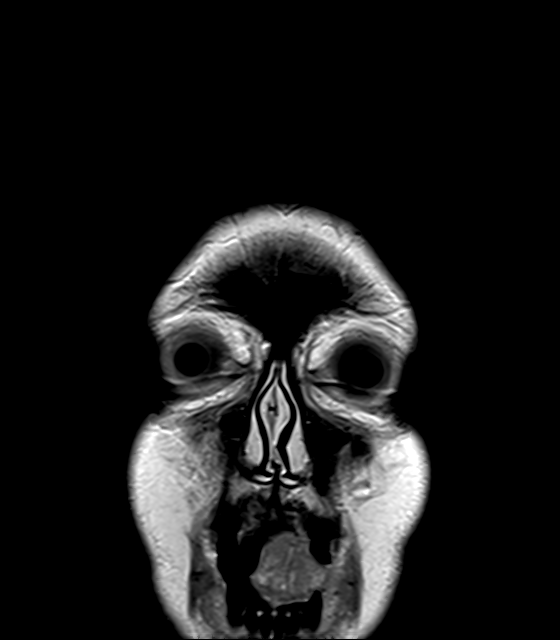
[im 29/29]
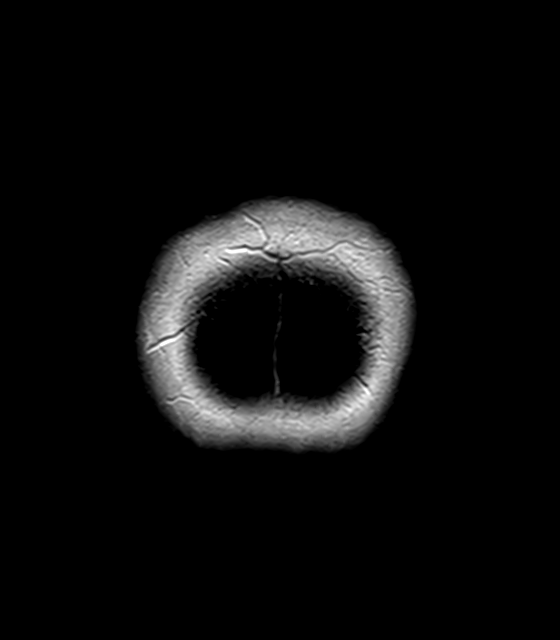

[34 of 48 positions shown; findings below may reference images not displayed]

FINDINGS: The study was performed under general anesthesia.

MRI HEAD FINDINGS

Brain: There is no evidence of acute infarct, intracranial
hemorrhage, mass, midline shift, or extra-axial fluid collection.
The ventricles and sulci are normal. Several punctate foci of T2
hyperintensity in the bilateral periatrial white matter are
unchanged and nonspecific. No abnormal enhancement is identified.
Minimal cerebellar tonsillar ectopia of 2 mm is unchanged. Dedicated
thin section imaging through the temporal lobes demonstrates normal
volume and signal of the hippocampi. There is no evidence of
heterotopia or cortical dysplasia.

Vascular: Major intracranial vascular flow voids are preserved.

Skull and upper cervical spine: Unremarkable bone marrow signal.

Other: None.

MRI ORBITS FINDINGS

Orbits: The globes appear intact. The optic nerves are symmetric in
size and normal in signal without abnormal enhancement. No orbital
mass or inflammation is identified. The extraocular muscles and
lacrimal glands are unremarkable.

Visualized sinuses: Paranasal sinuses and mastoid air cells are
clear.

Soft tissues: Unremarkable.

Limited intracranial: Unremarkable appearance of the optic chiasm,
cavernous sinuses, and pituitary gland.
IMPRESSION: 1. Unchanged and largely unremarkable appearance of the brain. No
acute intracranial abnormality, mass, or etiology of seizures
identified.
2. Unremarkable MRI of the orbits.

## 2019-12-16 SURGERY — MRI WITH ANESTHESIA
Anesthesia: General

## 2019-12-16 MED ORDER — LACTATED RINGERS IV SOLN: INTRAVENOUS | Status: DC

## 2019-12-16 MED ORDER — PHENYLEPHRINE HCL-NACL 10-0.9 MG/250ML-% IV SOLN
INTRAVENOUS | Status: DC | PRN
  Administered 2019-12-16: 25 ug/min via INTRAVENOUS

## 2019-12-16 MED ORDER — ROCURONIUM BROMIDE 10 MG/ML (PF) SYRINGE
PREFILLED_SYRINGE | INTRAVENOUS | Status: DC | PRN
  Administered 2019-12-16: 50 mg via INTRAVENOUS

## 2019-12-16 MED ORDER — OXYCODONE HCL 5 MG PO TABS: 5.0000 mg | ORAL_TABLET | Freq: Once | ORAL | Status: DC | PRN

## 2019-12-16 MED ORDER — CHLORHEXIDINE GLUCONATE 0.12 % MT SOLN
OROMUCOSAL | Status: AC
  Administered 2019-12-16: 15 mL via OROMUCOSAL
  Filled 2019-12-16: qty 15

## 2019-12-16 MED ORDER — LIDOCAINE 2% (20 MG/ML) 5 ML SYRINGE
INTRAMUSCULAR | Status: DC | PRN
  Administered 2019-12-16: 60 mg via INTRAVENOUS

## 2019-12-16 MED ORDER — OXYCODONE HCL 5 MG/5ML PO SOLN: 5.0000 mg | Freq: Once | ORAL | Status: DC | PRN

## 2019-12-16 MED ORDER — FENTANYL CITRATE (PF) 100 MCG/2ML IJ SOLN: 25.0000 ug | INTRAMUSCULAR | Status: DC | PRN

## 2019-12-16 MED ORDER — MIDAZOLAM HCL 5 MG/5ML IJ SOLN
INTRAMUSCULAR | Status: DC | PRN
  Administered 2019-12-16: 2 mg via INTRAVENOUS

## 2019-12-16 MED ORDER — ONDANSETRON HCL 4 MG/2ML IJ SOLN
INTRAMUSCULAR | Status: DC | PRN
  Administered 2019-12-16: 4 mg via INTRAVENOUS

## 2019-12-16 MED ORDER — DEXAMETHASONE SODIUM PHOSPHATE 10 MG/ML IJ SOLN
INTRAMUSCULAR | Status: DC | PRN
  Administered 2019-12-16: 5 mg via INTRAVENOUS

## 2019-12-16 MED ORDER — ONDANSETRON HCL 4 MG/2ML IJ SOLN: 4.0000 mg | Freq: Four times a day (QID) | INTRAMUSCULAR | Status: DC | PRN

## 2019-12-16 MED ORDER — ORAL CARE MOUTH RINSE: 15.0000 mL | Freq: Once | OROMUCOSAL | Status: AC

## 2019-12-16 MED ORDER — EPHEDRINE SULFATE 50 MG/ML IJ SOLN
INTRAMUSCULAR | Status: DC | PRN
  Administered 2019-12-16: 15 mg via INTRAVENOUS

## 2019-12-16 MED ORDER — GADOBUTROL 1 MMOL/ML IV SOLN
7.0000 mL | Freq: Once | INTRAVENOUS | Status: AC | PRN
  Administered 2019-12-16: 7 mL via INTRAVENOUS

## 2019-12-16 MED ORDER — PROPOFOL 10 MG/ML IV BOLUS
INTRAVENOUS | Status: DC | PRN
  Administered 2019-12-16: 180 mg via INTRAVENOUS

## 2019-12-16 MED ORDER — CHLORHEXIDINE GLUCONATE 0.12 % MT SOLN: 15.0000 mL | Freq: Once | OROMUCOSAL | Status: AC

## 2019-12-16 MED ORDER — SUGAMMADEX SODIUM 200 MG/2ML IV SOLN
INTRAVENOUS | Status: DC | PRN
  Administered 2019-12-16: 200 mg via INTRAVENOUS

## 2019-12-16 MED ORDER — FENTANYL CITRATE (PF) 100 MCG/2ML IJ SOLN
INTRAMUSCULAR | Status: DC | PRN
  Administered 2019-12-16: 25 ug via INTRAVENOUS

## 2019-12-16 NOTE — Anesthesia Procedure Notes (Signed)
Procedure Name: Intubation Date/Time: 12/16/2019 8:34 AM Performed by: Elliot Dally, CRNA Pre-anesthesia Checklist: Patient identified, Emergency Drugs available, Suction available and Patient being monitored Patient Re-evaluated:Patient Re-evaluated prior to induction Oxygen Delivery Method: Circle System Utilized Preoxygenation: Pre-oxygenation with 100% oxygen Induction Type: IV induction Ventilation: Mask ventilation without difficulty Laryngoscope Size: Miller and 2 Grade View: Grade I Tube type: Oral Tube size: 6.5 mm Number of attempts: 1 Airway Equipment and Method: Stylet and Oral airway Placement Confirmation: ETT inserted through vocal cords under direct vision,  positive ETCO2 and breath sounds checked- equal and bilateral Secured at: 23 cm Tube secured with: Tape Dental Injury: Teeth and Oropharynx as per pre-operative assessment

## 2019-12-16 NOTE — Transfer of Care (Signed)
Immediate Anesthesia Transfer of Care Note  Patient: Margaret Farley  Procedure(s) Performed: MRI WITH ANESTHESIA OF BRAIN WITH AND WITHOUT CONTRAST, MRI OF ORBITS WITH AND WITHOUT CONTRAST (N/A )  Patient Location: PACU  Anesthesia Type:General  Level of Consciousness: drowsy  Airway & Oxygen Therapy: Patient Spontanous Breathing and Patient connected to nasal cannula oxygen  Post-op Assessment: Report given to RN and Post -op Vital signs reviewed and stable  Post vital signs: Reviewed and stable  Last Vitals:  Vitals Value Taken Time  BP    Temp    Pulse 81 12/16/19 1000  Resp    SpO2 100 % 12/16/19 1000  Vitals shown include unvalidated device data.  Last Pain:  Vitals:   12/16/19 0636  TempSrc:   PainSc: 0-No pain         Complications: No complications documented.

## 2019-12-16 NOTE — Telephone Encounter (Signed)
-----   Message from Van Clines, MD sent at 12/16/2019 12:01 PM EDT ----- Pls let her mother know that the MRI brain and orbits was normal, no evidence of tumor, stroke, or bleed. No change from her last MRI in August. Thanks

## 2019-12-16 NOTE — Discharge Instructions (Signed)
General Anesthesia, Adult, Care After This sheet gives you information about how to care for yourself after your procedure. Your health care provider may also give you more specific instructions. If you have problems or questions, contact your health care provider. What can I expect after the procedure? After the procedure, the following side effects are common:  Pain or discomfort at the IV site.  Nausea.  Vomiting.  Sore throat.  Trouble concentrating.  Feeling cold or chills.  Weak or tired.  Sleepiness and fatigue.  Soreness and body aches. These side effects can affect parts of the body that were not involved in surgery. Follow these instructions at home:  For at least 24 hours after the procedure:  Have a responsible adult stay with you. It is important to have someone help care for you until you are awake and alert.  Rest as needed.  Do not: ? Participate in activities in which you could fall or become injured. ? Drive. ? Use heavy machinery. ? Drink alcohol. ? Take sleeping pills or medicines that cause drowsiness. ? Make important decisions or sign legal documents. ? Take care of children on your own. Eating and drinking  Follow any instructions from your health care provider about eating or drinking restrictions.  When you feel hungry, start by eating small amounts of foods that are soft and easy to digest (bland), such as toast. Gradually return to your regular diet.  Drink enough fluid to keep your urine pale yellow.  If you vomit, rehydrate by drinking water, juice, or clear broth. General instructions  If you have sleep apnea, surgery and certain medicines can increase your risk for breathing problems. Follow instructions from your health care provider about wearing your sleep device: ? Anytime you are sleeping, including during daytime naps. ? While taking prescription pain medicines, sleeping medicines, or medicines that make you drowsy.  Return to  your normal activities as told by your health care provider. Ask your health care provider what activities are safe for you.  Take over-the-counter and prescription medicines only as told by your health care provider.  If you smoke, do not smoke without supervision.  Keep all follow-up visits as told by your health care provider. This is important. Contact a health care provider if:  You have nausea or vomiting that does not get better with medicine.  You cannot eat or drink without vomiting.  You have pain that does not get better with medicine.  You are unable to pass urine.  You develop a skin rash.  You have a fever.  You have redness around your IV site that gets worse. Get help right away if:  You have difficulty breathing.  You have chest pain.  You have blood in your urine or stool, or you vomit blood. Summary  After the procedure, it is common to have a sore throat or nausea. It is also common to feel tired.  Have a responsible adult stay with you for the first 24 hours after general anesthesia. It is important to have someone help care for you until you are awake and alert.  When you feel hungry, start by eating small amounts of foods that are soft and easy to digest (bland), such as toast. Gradually return to your regular diet.  Drink enough fluid to keep your urine pale yellow.  Return to your normal activities as told by your health care provider. Ask your health care provider what activities are safe for you. This information is not   intended to replace advice given to you by your health care provider. Make sure you discuss any questions you have with your health care provider. Document Revised: 05/11/2017 Document Reviewed: 12/22/2016 Elsevier Patient Education  2020 [employer] Inc.  

## 2019-12-16 NOTE — Telephone Encounter (Signed)
Spoke to pt mother informed her that MRI brain and orbits was normal, no evidence of tumor, stroke, or bleed. No change from her last MRI in August.

## 2019-12-17 ENCOUNTER — Encounter (HOSPITAL_COMMUNITY): Payer: Self-pay | Admitting: Radiology

## 2019-12-17 NOTE — Anesthesia Postprocedure Evaluation (Signed)
Anesthesia Post Note  Patient: Maude Leriche  Procedure(s) Performed: MRI WITH ANESTHESIA OF BRAIN WITH AND WITHOUT CONTRAST, MRI OF ORBITS WITH AND WITHOUT CONTRAST (N/A )     Patient location during evaluation: PACU Anesthesia Type: General Level of consciousness: awake and alert Pain management: pain level controlled Vital Signs Assessment: post-procedure vital signs reviewed and stable Respiratory status: spontaneous breathing, nonlabored ventilation, respiratory function stable and patient connected to nasal cannula oxygen Cardiovascular status: blood pressure returned to baseline and stable Postop Assessment: no apparent nausea or vomiting Anesthetic complications: no   No complications documented.  Last Vitals:  Vitals:   12/16/19 1000 12/16/19 1015  BP: 108/75 115/80  Pulse: 81 84  Resp: 17 17  Temp: 36.6 C 36.6 C  SpO2: 100% 94%    Last Pain:  Vitals:   12/16/19 1000  TempSrc:   PainSc: 0-No pain                 Barnabas Henriques S

## 2020-01-03 ENCOUNTER — Other Ambulatory Visit: Payer: Self-pay | Admitting: Family Medicine

## 2020-02-27 ENCOUNTER — Other Ambulatory Visit: Payer: Self-pay | Admitting: Gastroenterology

## 2020-03-16 ENCOUNTER — Ambulatory Visit: Payer: Medicare Other | Admitting: Neurology

## 2020-03-30 ENCOUNTER — Other Ambulatory Visit: Payer: Self-pay | Admitting: Neurology

## 2020-04-03 ENCOUNTER — Other Ambulatory Visit: Payer: Self-pay | Admitting: Neurology

## 2020-04-03 ENCOUNTER — Other Ambulatory Visit: Payer: Self-pay | Admitting: Gastroenterology

## 2020-04-03 NOTE — Telephone Encounter (Signed)
No further refills without office visit 

## 2020-04-05 NOTE — Telephone Encounter (Signed)
Pls confirm how she is taking oxcarbazepine 300mg  tabs, is she taking 2 tabs BID or 3 tabs BID? thanks

## 2020-06-02 DIAGNOSIS — Z0289 Encounter for other administrative examinations: Secondary | ICD-10-CM

## 2020-07-25 IMAGING — CR DG ABDOMEN 1V
1 series · 1 of 1 positions shown · non-contrast
Comparison: None.

CLINICAL DATA: Abdominal pain and bloating over several months,
initial encounter

EXAM:
ABDOMEN - 1 VIEW

[w abdomen upright]
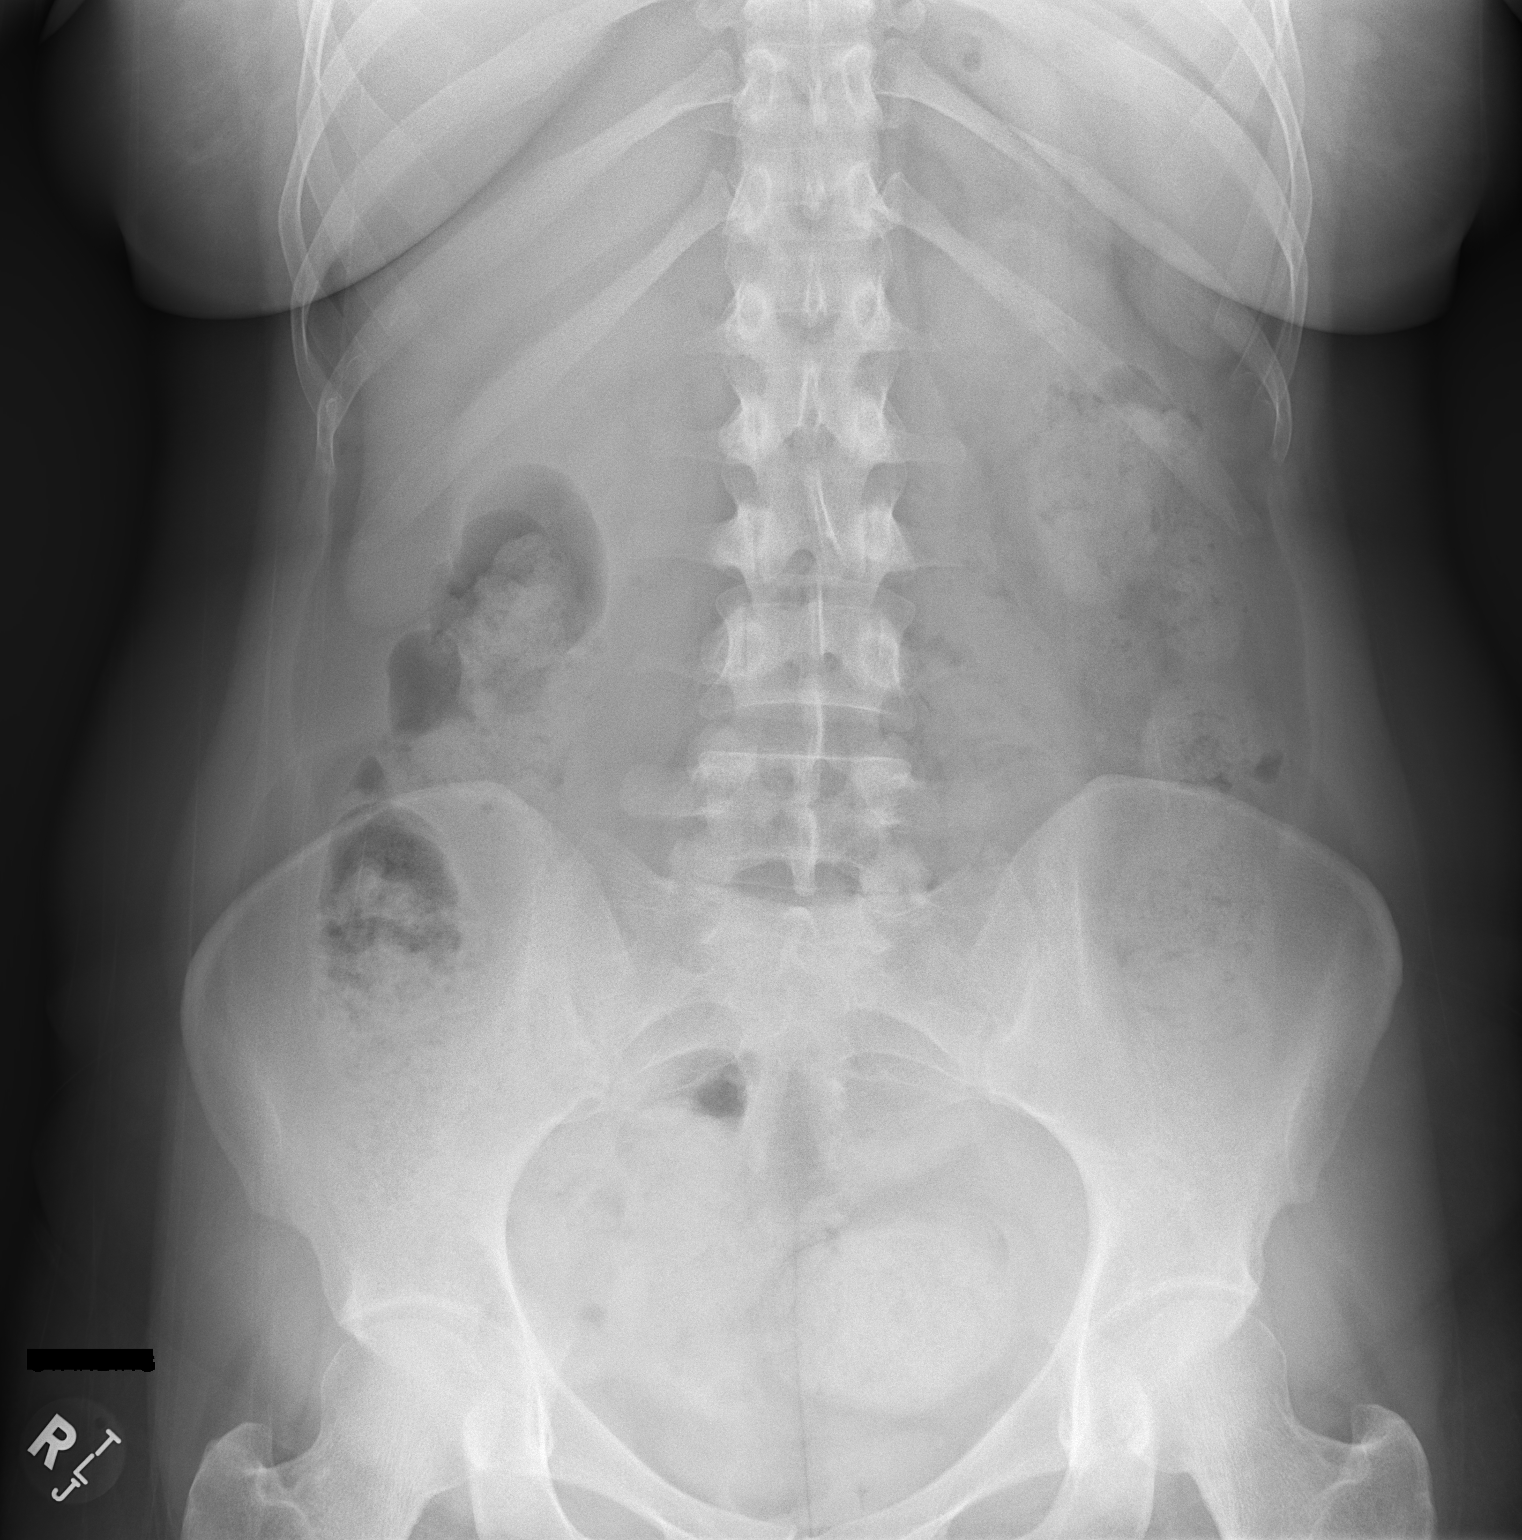

[1 of 1 positions shown; findings below may reference images not displayed]

FINDINGS: Scattered large and small bowel gas is noted. Moderate retained
fecal material is noted consistent with a degree of constipation. No
free air is noted. No obstructive changes are seen. Bony structures
are within normal limits.
IMPRESSION: Moderate retained fecal material consistent with constipation.

## 2020-08-16 ENCOUNTER — Other Ambulatory Visit: Payer: Self-pay

## 2020-08-16 ENCOUNTER — Encounter: Payer: Self-pay | Admitting: Neurology

## 2020-08-16 ENCOUNTER — Telehealth (INDEPENDENT_AMBULATORY_CARE_PROVIDER_SITE_OTHER): Payer: Medicare Other | Admitting: Neurology

## 2020-08-16 VITALS — Ht 63.0 in | Wt 145.0 lb

## 2020-08-16 DIAGNOSIS — Z029 Encounter for administrative examinations, unspecified: Secondary | ICD-10-CM

## 2020-08-16 DIAGNOSIS — Z5329 Procedure and treatment not carried out because of patient's decision for other reasons: Secondary | ICD-10-CM

## 2020-09-04 NOTE — Progress Notes (Signed)
No show for video visit

## 2020-09-28 ENCOUNTER — Other Ambulatory Visit: Payer: Self-pay | Admitting: Gastroenterology

## 2020-09-28 NOTE — Telephone Encounter (Signed)
Following message received from Dr. Orvan Falconer:  Must have office visit prior to additional refills. Last seen in 2020.

## 2020-09-28 NOTE — Telephone Encounter (Signed)
Must have office visit prior to additional refills. Last seen in 2020.

## 2020-11-26 ENCOUNTER — Ambulatory Visit: Payer: Medicare Other | Admitting: Neurology

## 2020-12-07 ENCOUNTER — Other Ambulatory Visit: Payer: Self-pay

## 2020-12-07 ENCOUNTER — Encounter (HOSPITAL_COMMUNITY): Payer: Self-pay | Admitting: Emergency Medicine

## 2020-12-07 ENCOUNTER — Ambulatory Visit (HOSPITAL_COMMUNITY)
Admission: EM | Admit: 2020-12-07 | Discharge: 2020-12-07 | Disposition: A | Discharge status: Home / Self Care | Payer: Medicare Other | Attending: Student | Admitting: Student

## 2020-12-07 DIAGNOSIS — Z0189 Encounter for other specified special examinations: Secondary | ICD-10-CM | POA: Insufficient documentation

## 2020-12-07 DIAGNOSIS — M79605 Pain in left leg: Secondary | ICD-10-CM | POA: Diagnosis not present

## 2020-12-07 LAB — COMPREHENSIVE METABOLIC PANEL
ALT: 22 U/L (ref 0–44)
AST: 31 U/L (ref 15–41)
Albumin: 4.5 g/dL (ref 3.5–5.0)
Alkaline Phosphatase: 131 U/L — ABNORMAL HIGH (ref 38–126)
Anion gap: 10 (ref 5–15)
BUN: 10 mg/dL (ref 6–20)
CO2: 26 mmol/L (ref 22–32)
Calcium: 9.6 mg/dL (ref 8.9–10.3)
Chloride: 102 mmol/L (ref 98–111)
Creatinine, Ser: 0.88 mg/dL (ref 0.44–1.00)
GFR, Estimated: >60 mL/min (ref >60)
Glucose, Bld: 125 mg/dL — ABNORMAL HIGH (ref 70–99)
Potassium: 3.6 mmol/L (ref 3.5–5.1)
Sodium: 138 mmol/L (ref 135–145)
Total Bilirubin: 0.6 mg/dL (ref 0.3–1.2)
Total Protein: 8 g/dL (ref 6.5–8.1)

## 2020-12-07 NOTE — Discharge Instructions (Addendum)
-  You do not have a blood clot. Symptoms of this to look out for: swelling in your leg, pain in your leg that's worse with standing and walking, shortness of breath, dizziness, chest pain. Seek additional medical attention if these symptoms develop. -Try to establish care with a PCP -Follow-up with psychiatrist for medication management

## 2020-12-07 NOTE — ED Provider Notes (Signed)
[facility]    CSN: [id number] Arrival date & time: 12/07/20  1622      History   Chief Complaint Chief Complaint  Patient presents with   Leg Pain    HPI Margaret Farley is a 30 y.o. female presenting with L leg pain that has completely resolved x3 days. Medical history ADD, anxiety, depression, schizophrenia, seizures.  Here today with mom who helps provide history.  They state that she was started on Invokana about 3 weeks ago by psychiatrist, and 2 weeks ago she developed some pain of the outer aspect of her left leg radiating from the calf to the thigh.  Pain was intermittent but worse at night and crampy.  States that they reported this to the psychiatrist who took her off Invokana and told her to get a blood clot work-up.  This was 2 weeks ago, she has been off Invokana for 2 weeks but they have not been able to have the blood clot work-up.  She is completely asymptomatic for 3 days, with no leg pain, swelling, tenderness, dizziness, chest pain, shortness of breath, weakness.  They do not currently have a primary care.    HPI  Past Medical History:  Diagnosis Date   ADD (attention deficit disorder)    Allergic rhinitis    Anxiety    Depression    GERD (gastroesophageal reflux disease)    diet controlled, otc med prn   HLD (hyperlipidemia)    diet controlled, no med   Ovarian cyst    HX   Schizophrenia (HCC)    Seizure (HCC)    last one 08/19/2018    Patient Active Problem List   Diagnosis Date Noted   Low back pain 09/16/2018   Irritable bowel syndrome without diarrhea 11/15/2017   Seizure (HCC) 07/30/2017   Schizophrenia, chronic condition with acute exacerbation (HCC) 07/23/2017   Schizophrenia, chronic with acute exacerbation (HCC) 07/23/2017    Past Surgical History:  Procedure Laterality Date   MRI     sedation at [facility]   RADIOLOGY WITH ANESTHESIA N/A 01/07/2019   Procedure: MRI WITH ANESTHESIA BRAIN WITH AND WITHOUT CONTRAST;  Surgeon:  Radiologist, Medication, MD;  Location: [facility] OR;  Service: Radiology;  Laterality: N/A;   RADIOLOGY WITH ANESTHESIA N/A 12/16/2019   Procedure: MRI WITH ANESTHESIA OF BRAIN WITH AND WITHOUT CONTRAST, MRI OF ORBITS WITH AND WITHOUT CONTRAST;  Surgeon: Radiologist, Medication, MD;  Location: [facility] OR;  Service: Radiology;  Laterality: N/A;    OB History   No obstetric history on file.      Home Medications    Prior to Admission medications   Medication Sig Start Date End Date Taking? Authorizing Provider  paliperidone (INVEGA) 3 MG 24 hr tablet Take 3 mg by mouth daily.   Yes [provider]  alprazolam Prudy Feeler) 2 MG tablet Take 2 mg by mouth 3 (three) times daily as needed for anxiety. Per Dr. Norma Fredrickson at [facility]    [provider]  gabapentin (NEURONTIN) 300 MG capsule TAKE 1 CAPSULE BY MOUTH NIGHTLY 01/06/20   Judi Saa, DO  haloperidol (HALDOL) 10 MG tablet Take 20-30 mg by mouth See admin instructions. Take 20 mg by mouth in the morning and 30 mg at night 06/12/18   [provider]  MELATONIN PO Take 1 tablet by mouth at bedtime.    [provider]  meloxicam (MOBIC) 15 MG tablet TAKE 1 TABLET BY MOUTH EVERY DAY Patient not taking: No  sig reported 10/28/19   Judi Saa, DO  Oxcarbazepine (TRILEPTAL) 300 MG tablet Take 2 tablets 3 times a day 04/05/20   Van Clines, MD  oxymetazoline (AFRIN) 0.05 % nasal spray Place 1 spray into both nostrils 2 (two) times daily as needed for congestion.    [provider]  pantoprazole (PROTONIX) 40 MG tablet TAKE 1 TABLET (40 MG TOTAL) BY MOUTH DAILY. PATIENT NEEDS OFFICE VISIT FOR FURTHER REFILLS. 09/28/20   Tressia Danas, MD  Phenylephrine-DM-GG-APAP (TYLENOL COLD/FLU SEVERE PO) Take 2 tablets by mouth at bedtime.    [provider]  QUEtiapine (SEROQUEL) 100 MG tablet Take 200 mg by mouth 2 (two) times daily.  06/12/18   [provider]  zolpidem (AMBIEN) 10 MG tablet Take  10 mg by mouth at bedtime.  06/11/18   [provider]    Family History Family History  Problem Relation Age of Onset   Heart disease Father        MI age unknown   Parkinson's disease Father    Cancer Maternal Grandmother        breast   High Cholesterol Mother    High blood pressure Mother    Depression Mother    Anxiety disorder Mother     Social History Social History   Tobacco Use   Smoking status: Former    Packs/day: 1.00    Years: 3.00    Pack years: 3.00    Types: Cigarettes   Smokeless tobacco: Never  Vaping Use   Vaping Use: Every day   Substances: Nicotine  Substance Use Topics   Alcohol use: No   Drug use: No     Allergies   Benadryl allergy [diphenhydramine]   Review of Systems Review of Systems  All other systems reviewed and are negative.   Physical Exam Triage Vital Signs ED Triage Vitals  Enc Vitals Group     BP 12/07/20 1744 112/78     Pulse Rate 12/07/20 1744 96     Resp 12/07/20 1744 16     Temp 12/07/20 1744 98.2 F (36.8 C)     Temp Source 12/07/20 1744 Oral     SpO2 12/07/20 1744 95 %     Weight --      Height --      Head Circumference --      Peak Flow --      Pain Score 12/07/20 1741 0     Pain Loc --      Pain Edu? --      Excl. in GC? --    No data found.  Updated Vital Signs BP 112/78 (BP Location: Right Arm)   Pulse 96   Temp 98.2 F (36.8 C) (Oral)   Resp 16   LMP  (LMP Unknown)   SpO2 95%   Visual Acuity Right Eye Distance:   Left Eye Distance:   Bilateral Distance:    Right Eye Near:   Left Eye Near:    Bilateral Near:     Physical Exam Vitals reviewed.  Constitutional:      General: She is not in acute distress.    Appearance: Normal appearance. She is not ill-appearing or diaphoretic.  HENT:     Head: Normocephalic and atraumatic.  Cardiovascular:     Rate and Rhythm: Normal rate and regular rhythm.     Heart sounds: Normal heart sounds.     Comments: No leg swelling,  tenderness, edema. Negative homan sign. DP 2+,  cap refill <2 seconds. Pulmonary:     Effort: Pulmonary effort is normal.     Breath sounds: Normal breath sounds.  Musculoskeletal:     Right lower leg: No edema.     Left lower leg: No edema.  Skin:    General: Skin is warm.  Neurological:     General: No focal deficit present.     Mental Status: She is alert and oriented to person, place, and time.  Psychiatric:        Mood and Affect: Mood normal.        Behavior: Behavior normal.        Thought Content: Thought content normal.        Judgment: Judgment normal.     UC Treatments / Results  Labs (all labs ordered are listed, but only abnormal results are displayed) Labs Reviewed  COMPREHENSIVE METABOLIC PANEL    EKG   Radiology No results found.  Procedures Procedures (including critical care time)  Medications Ordered in UC Medications - No data to display  Initial Impression / Assessment and Plan / UC Course  I have reviewed the triage vital signs and the nursing notes.  Pertinent labs & imaging results that were available during my care of the patient were reviewed by me and considered in my medical decision making (see chart for details).     This patient is a very pleasant 30 y.o. year old female presenting with leg pain that has completely resolved, completely benign exam today. Well's score for DVT is 0. Will check a CMP.  Follow-up with primary care and psychiatry. ED return precautions discussed. Mom verbalizes understanding and agreement.     Final Clinical Impressions(s) / UC Diagnoses   Final diagnoses:  Pain of left lower extremity  Routine lab draw     Discharge Instructions      -You do not have a blood clot. Symptoms of this to look out for: swelling in your leg, pain in your leg that's worse with standing and walking, shortness of breath, dizziness, chest pain. Seek additional medical attention if these symptoms develop. -Try to establish  care with a PCP -Follow-up with psychiatrist for medication management     ED Prescriptions   None    PDMP not reviewed this encounter.   Rhys Martini, PA-C 12/07/20 1825

## 2020-12-07 NOTE — ED Triage Notes (Signed)
Patient c/o LFT leg pain x 2 weeks.   Patient denies swelling to leg.   Patient endorses "a couple bruises" to leg.   Patient endorses increased pain with laying.   Patient was told to stop Invega by psychiatric doctor per mothers statements.   Patient has taken Biofreeze and Tylenol with no relief of symptoms.

## 2020-12-26 ENCOUNTER — Other Ambulatory Visit: Payer: Self-pay | Admitting: Gastroenterology

## 2021-01-28 ENCOUNTER — Ambulatory Visit: Payer: Medicare Other | Admitting: Internal Medicine

## 2021-02-09 ENCOUNTER — Ambulatory Visit
Admission: RE | Admit: 2021-02-09 | Discharge: 2021-02-09 | Disposition: A | Discharge status: Home / Self Care | Payer: Medicare Other | Source: Ambulatory Visit | Attending: *Deleted | Admitting: *Deleted

## 2021-02-09 ENCOUNTER — Other Ambulatory Visit: Payer: Self-pay | Admitting: *Deleted

## 2021-02-09 DIAGNOSIS — R140 Abdominal distension (gaseous): Secondary | ICD-10-CM

## 2021-02-28 ENCOUNTER — Encounter: Payer: Self-pay | Admitting: Gastroenterology

## 2021-02-28 ENCOUNTER — Ambulatory Visit (INDEPENDENT_AMBULATORY_CARE_PROVIDER_SITE_OTHER): Payer: Medicare Other | Admitting: Gastroenterology

## 2021-02-28 VITALS — BP 114/80 | HR 88 | Ht 62.0 in | Wt 164.2 lb

## 2021-02-28 DIAGNOSIS — R140 Abdominal distension (gaseous): Secondary | ICD-10-CM

## 2021-02-28 MED ORDER — LINACLOTIDE 72 MCG PO CAPS: 72.0000 ug | ORAL_CAPSULE | Freq: Every day | ORAL | 11 refills | Status: DC

## 2021-02-28 NOTE — Progress Notes (Signed)
Referring Provider: Arby Barrette, FNP Primary Care Physician:  Worthy Rancher, MD (Inactive)   Reason for Consultation: Abdominal distention   IMPRESSION:  Constipation with a BM every 7-10 days Moderate stool burden on abdominal films Nausea and bloating - relieved with defecation Resistant to testing including imaging and endoscopy  Discussed strategies to manage symptoms. Hopefully, nausea, bloating, and constipation will improve with Linzess and improved frequency of bowel movements. If not, will need to consider EGD +/- abdominal imaging for additional evaluation.   She uses NSAIDs and concurrent upper GI pathology must be considered if symptoms do not improve.    PLAN: - Trial of Linzess 72 mcg daily - Obtain recent labs from [facility] - if not performed needs CBC, CMP, lipase, and TSH - Follow-up in one month, earlier if needed - EGD +/- abdominal imaging if not improving with Linzess  HPI: Margaret Farley is a 30 y.o. female referred by NP Loreta Ave for further evaluation of abdominal distention.  The history is obtained through the patient, her electronic health record, and records provided by NP Mann. Her mother accompanies her to this appointment and contributes to the history.   She has a history of reflux, for seizures, OCD, panic attacks, schizophrenia, ADD, anxiety, depression, hypercholesterolemia, and obesity.  She presents today for evaluation of nausea, bloating, reflux and alternating diarrhea and constipation. Has nausea with and and without eating.   Developed nausea last month - initially occurring throughout the day. Now primarily post-prandial.   She will have a bowel movement every 7-10 days. Some straining. Stools are described as long, hard, and black.  Sense of complete evacuation. No blood or mucous.  Some relief with the nausea after a good bowel movement.   She is using Peptobismal with some control her symptoms.   She does not not following any  special diets. Drinks a soda most daily. Vapes regularly. Denies marijuana use.   Abdominal x-ray 02/09/2021 for abdominal pain showed moderate retained fecal material consistent with constipation  Transvaginal ultrasound 01/18/2021 showed a thickened avascular heterogeneous endometrium.  It was otherwise normal.  Labs 12/07/20 showed normal CMP Last TSH 07/25/2017 was normal  IBS runs in her maternal family. No known family history of colon cancer or polyps.   Past Medical History:  Diagnosis Date   ADD (attention deficit disorder)    Allergic rhinitis    Anxiety    Depression    GERD (gastroesophageal reflux disease)    diet controlled, otc med prn   HLD (hyperlipidemia)    diet controlled, no med   Ovarian cyst    HX   Schizophrenia (HCC)    Seizure (HCC)    last one 08/19/2018    Past Surgical History:  Procedure Laterality Date   MRI     sedation at [facility]   RADIOLOGY WITH ANESTHESIA N/A 01/07/2019   Procedure: MRI WITH ANESTHESIA BRAIN WITH AND WITHOUT CONTRAST;  Surgeon: Radiologist, Medication, MD;  Location: [facility];  Service: Radiology;  Laterality: N/A;   RADIOLOGY WITH ANESTHESIA N/A 12/16/2019   Procedure: MRI WITH ANESTHESIA OF BRAIN WITH AND WITHOUT CONTRAST, MRI OF ORBITS WITH AND WITHOUT CONTRAST;  Surgeon: Radiologist, Medication, MD;  Location: [facility];  Service: Radiology;  Laterality: N/A;     Current Outpatient Medications  Medication Sig Dispense Refill   cloZAPine (CLOZARIL) 100 MG tablet Take 3.5 tablets by mouth at bedtime.     diazepam (VALIUM) 10 MG tablet Take 10 mg by mouth  3 (three) times daily.     gabapentin (NEURONTIN) 300 MG capsule TAKE 1 CAPSULE BY MOUTH NIGHTLY 30 capsule 3   hydrOXYzine (VISTARIL) 25 MG capsule Take 25-50 mg by mouth at bedtime as needed.     medroxyPROGESTERone (PROVERA) 10 MG tablet Take 10 mg by mouth daily.     MELATONIN PO Take 1 tablet by mouth at bedtime.     meloxicam (MOBIC) 15 MG tablet TAKE 1 TABLET BY MOUTH  EVERY DAY 30 tablet 0   Oxcarbazepine (TRILEPTAL) 300 MG tablet Take 2 tablets 3 times a day 180 tablet 5   paliperidone (INVEGA) 3 MG 24 hr tablet Take 3 mg by mouth daily.     Phenylephrine-DM-GG-APAP (TYLENOL COLD/FLU SEVERE PO) Take 2 tablets by mouth at bedtime.     propranolol (INDERAL) 20 MG tablet Take 20 mg by mouth 3 (three) times daily.     QUEtiapine (SEROQUEL) 100 MG tablet Take 200 mg by mouth 2 (two) times daily.      rosuvastatin (CRESTOR) 10 MG tablet Take 10 mg by mouth at bedtime.     sertraline (ZOLOFT) 100 MG tablet Take 100 mg by mouth daily.     No current facility-administered medications for this visit.    Allergies as of 02/28/2021 - Review Complete 02/28/2021  Allergen Reaction Noted   Benadryl allergy [diphenhydramine] Other (See Comments) 03/27/2017    Family History  Problem Relation Age of Onset   High Cholesterol Mother    High blood pressure Mother    Depression Mother    Anxiety disorder Mother    Heart disease Father        MI age unknown   Parkinson's disease Father        Lewybody   Breast cancer Maternal Grandmother    Heart disease Paternal Grandmother    Breast cancer Other        maternal great aunts x 2, one deceased       Review of Systems: 12 system ROS is negative except as noted above.   Physical Exam: General:   Alert,  well-nourished, pleasant and cooperative in NAD Head:  Normocephalic and atraumatic. Eyes:  Sclera clear, no icterus.   Conjunctiva pink. Ears:  Normal auditory acuity. Nose:  No deformity, discharge,  or lesions. Mouth:  No deformity or lesions.   Neck:  Supple; no masses or thyromegaly. Lungs:  Clear throughout to auscultation.   No wheezes. Heart:  Regular rate and rhythm; no murmurs. Abdomen:  Soft,nontender, nondistended, normal bowel sounds, no rebound or guarding. No hepatosplenomegaly.   Rectal:  Deferred  Msk:  Symmetrical. No boney deformities LAD: No inguinal or umbilical LAD Extremities:   No clubbing or edema. Neurologic:  Alert and  oriented x4;  grossly nonfocal Skin:  Intact without significant lesions or rashes. Psych:  Alert and cooperative. Normal mood and affect.    Egan Sahlin L. Orvan Falconer, MD, MPH 02/28/2021, 9:41 AM

## 2021-02-28 NOTE — Patient Instructions (Addendum)
We have sent the following medications to your pharmacy for you to pick up at your convenience:Linzess 72 mcg daily before breakfast.   Please follow up in one month with Dr. Orvan Falconer.   The [facility] providers would like to encourage you to use [facility] to communicate with providers for non-urgent requests or questions.  Due to long hold times on the telephone, sending your provider a message by [facility] may be a faster and more efficient way to get a response.  Please allow 48 business hours for a response.  Please remember that this is for non-urgent requests.   Normal BMI (Body Mass Index- based on height and weight) is between 19 and 25. Your BMI today is Body mass index is 30.04 kg/m. Marland Kitchen Please consider follow up  regarding your BMI with your Primary Care Provider.  Thank you for choosing me and [facility].  Tressia Danas, M.D. MPH

## 2021-03-25 ENCOUNTER — Other Ambulatory Visit: Payer: Self-pay | Admitting: Gastroenterology

## 2021-03-31 ENCOUNTER — Ambulatory Visit: Payer: Medicare Other | Admitting: Gastroenterology

## 2021-04-04 ENCOUNTER — Ambulatory Visit: Payer: Medicare Other | Admitting: Neurology

## 2021-04-18 ENCOUNTER — Other Ambulatory Visit: Payer: Self-pay | Admitting: Gastroenterology

## 2021-05-03 ENCOUNTER — Other Ambulatory Visit: Payer: Self-pay | Admitting: Gastroenterology

## 2021-05-31 ENCOUNTER — Other Ambulatory Visit: Payer: Self-pay | Admitting: Gastroenterology

## 2021-07-27 ENCOUNTER — Other Ambulatory Visit: Payer: Self-pay | Admitting: Gastroenterology

## 2021-08-03 ENCOUNTER — Encounter: Payer: Self-pay | Admitting: Neurology

## 2021-08-03 ENCOUNTER — Other Ambulatory Visit: Payer: Self-pay

## 2021-08-03 ENCOUNTER — Ambulatory Visit (INDEPENDENT_AMBULATORY_CARE_PROVIDER_SITE_OTHER): Payer: Medicare Other | Admitting: Neurology

## 2021-08-03 VITALS — BP 100/60 | HR 89 | Resp 18 | Ht 63.0 in | Wt 147.0 lb

## 2021-08-03 DIAGNOSIS — F419 Anxiety disorder, unspecified: Secondary | ICD-10-CM | POA: Diagnosis not present

## 2021-08-03 DIAGNOSIS — G40209 Localization-related (focal) (partial) symptomatic epilepsy and epileptic syndromes with complex partial seizures, not intractable, without status epilepticus: Secondary | ICD-10-CM | POA: Diagnosis not present

## 2021-08-03 NOTE — Patient Instructions (Signed)
Good to see you. ? ?Schedule 30-minute EEG, then we will do a 24-hour EEG ? ?2. Referral will be sent to [facility] ? ?3. Follow-up after EEG ? ? ?Seizure Precautions: ?1. If medication has been prescribed for you to prevent seizures, take it exactly as directed.  Do not stop taking the medicine without talking to your doctor first, even if you have not had a seizure in a long time.  ? ?2. Avoid activities in which a seizure would cause danger to yourself or to others.  Don't operate dangerous machinery, swim alone, or climb in high or dangerous places, such as on ladders, roofs, or girders.  Do not drive unless your doctor says you may. ? ?3. If you have any warning that you may have a seizure, lay down in a safe place where you can't hurt yourself.   ? ?4.  No driving for 6 months from last seizure, as per [facility].   Please refer to the following link on the [employer]'s website for more information: http://www.epilepsyfoundation.org/answerplace/Social/driving/drivingu.cfm  ? ?5.  Maintain good sleep hygiene. Avoid alcohol. ? ?6.  Notify your neurology if you are planning pregnancy or if you become pregnant. ? ?7.  Contact your doctor if you have any problems that may be related to the medicine you are taking. ? ?8.  Call 911 and bring the patient back to the ED if: ?      ? A.  The seizure lasts longer than 5 minutes.      ? B.  The patient doesn't awaken shortly after the seizure ? C.  The patient has new problems such as difficulty seeing, speaking or moving ? D.  The patient was injured during the seizure ? E.  The patient has a temperature over 102 F (39C) ? F.  The patient vomited and now is having trouble breathing ?      ? ?
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
## 2021-08-17 ENCOUNTER — Ambulatory Visit (INDEPENDENT_AMBULATORY_CARE_PROVIDER_SITE_OTHER): Payer: Medicare Other | Admitting: Neurology

## 2021-08-17 DIAGNOSIS — G40209 Localization-related (focal) (partial) symptomatic epilepsy and epileptic syndromes with complex partial seizures, not intractable, without status epilepticus: Secondary | ICD-10-CM | POA: Diagnosis not present

## 2021-08-17 NOTE — Procedures (Signed)
ELECTROENCEPHALOGRAM REPORT ? ?Date of Study: 08/17/2021 ? ?Patient's Name: Margaret Farley ?MRN: [id number] ?Date of Birth: 1991/05/04 ? ?Referring Provider: Dr. Patrcia Dolly ? 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 EEG for classification. ? ?Medications: ?NEURONTIN 300 MG capsule ?CLOZARIL 100 MG tablet ?VALIUM 10 MG tablet ?VISTARIL 25 MG capsule ?LINZESS 72 MCG capsule ?PROVERA 10 MG tablet ?MELATONIN PO ?MOBIC 15 MG t-ablet ?TRILEPTAL 300 MG tablet ?INVEGA 3 MG 24 hr tablet ?PROTONIX 40 MG tablet ?TYLENOL COLD/FLU SEVERE PO ?INDERAL 20 MG tablet ?SEROQUEL 100 MG tablet  ?CRESTOR 10 MG tablet ?ZOLOFT 100 MG tablet ? ?Technical Summary: ?A multichannel digital EEG recording measured by the international 10-20 system with electrodes applied with paste and impedances below 5000 ohms performed in our laboratory with EKG monitoring in an awake and asleep patient.  Hyperventilation was not performed. Photic stimulation was performed.  The digital EEG was referentially recorded, reformatted, and digitally filtered in a variety of bipolar and referential montages for optimal display.   ? ?Description: ?The patient is awake and asleep during the recording.  During maximal wakefulness, there is a symmetric, medium voltage 8-8.5 Hz posterior dominant rhythm that attenuates with eye opening.  The record is symmetric.  During drowsiness and sleep, there is an increase in theta and delta slowing of the background.  Vertex waves and symmetric sleep spindles were seen.  Photic stimulation did not elicit any abnormalities.  There were no epileptiform discharges or electrographic seizures seen.   ? ?EKG lead was unremarkable. ? ?Impression: ?This awake and asleep EEG is normal.   ? ?Clinical Correlation: ?A normal EEG does not  exclude a clinical diagnosis of epilepsy.  If further clinical questions remain, prolonged EEG may be helpful.  Clinical correlation is advised. ? ? ?Patrcia Dolly, M.D. ? ?

## 2021-08-19 ENCOUNTER — Telehealth: Payer: Self-pay

## 2021-08-19 NOTE — Telephone Encounter (Signed)
-----   Message from Karen M Aquino, MD sent at 08/19/2021  8:45 AM EDT ----- ?Pls let patient/mother know the EEG was normal, proceed with prolonged EEG as scheduled, thanks ?

## 2021-08-19 NOTE — Telephone Encounter (Signed)
-----   Message from Cameron Sprang, MD sent at 08/19/2021  8:45 AM EDT ----- ?Pls let patient/mother know the EEG was normal, proceed with prolonged EEG as scheduled, thanks ?

## 2021-08-19 NOTE — Telephone Encounter (Signed)
Pt called no answer voice mail full  

## 2021-08-19 NOTE — Telephone Encounter (Signed)
Spoke with pt mom informed her that EEG was normal, proceed with prolonged EEG as scheduled ?

## 2021-09-12 ENCOUNTER — Ambulatory Visit: Payer: Medicare Other | Admitting: Neurology

## 2021-09-21 ENCOUNTER — Ambulatory Visit (INDEPENDENT_AMBULATORY_CARE_PROVIDER_SITE_OTHER): Payer: Medicare Other | Admitting: Neurology

## 2021-09-21 ENCOUNTER — Other Ambulatory Visit: Payer: Medicare Other

## 2021-09-21 DIAGNOSIS — G40209 Localization-related (focal) (partial) symptomatic epilepsy and epileptic syndromes with complex partial seizures, not intractable, without status epilepticus: Secondary | ICD-10-CM

## 2021-10-04 NOTE — Procedures (Signed)
ELECTROENCEPHALOGRAM REPORT ? ?Dates of Recording: 09/21/2021 9:54AM to 09/22/2021 10:41AM ? ?Patient's Name: Margaret Farley ?MRN: [id number] ?Date of Birth: 1990-06-03 ? ?Referring Provider: Dr. Patrcia Dolly ? ?Procedure: 24-hour ambulatory video EEG ? ?History: This is a 31 year old woman with a history of seizures, off seizure medication, with 2 nocturnal episodes where eyes rolled back and leg extended. EEG for classification.  ? ?Medications:  ?CLOZARIL 100 MG tablet ?VALIUM 10 MG tablet ?NEURONTIN 300 MG capsule ?VISTARIL 25 MG capsule ?LINZESS 72 MCG capsule ?PROVERA 10 MG tablet ?MELATONIN PO ?MOBIC 15 MG t-ablet ?INVEGA 3 MG 24 hr tablet ?PROTONIX 40 MG tablet ?TYLENOL COLD/FLU SEVERE PO ?INDERAL 20 MG tablet ?SEROQUEL 100 MG tablet  ?CRESTOR 10 MG tablet ?ZOLOFT 100 MG tablet ? ?Technical Summary: ?This is a 24-hour multichannel digital video EEG recording measured by the international 10-20 system with electrodes applied with paste and impedances below 5000 ohms performed as portable with EKG monitoring.  The digital EEG was referentially recorded, reformatted, and digitally filtered in a variety of bipolar and referential montages for optimal display.   ? ?DESCRIPTION OF RECORDING: ?During maximal wakefulness, the background activity consisted of a symmetric 8 Hz posterior dominant rhythm which was reactive to eye opening.  There were no epileptiform discharges or focal slowing seen in wakefulness. ? ?During the recording, the patient progresses through wakefulness, drowsiness, and Stage 2 sleep. During drowsiness and sleep, there is an increase in theta and delta slowing of the background, at times sharply contoured over the bilateral temporal regions without clear epileptogenic potential.  Again, there were no clear epileptiform discharges seen. ? ?Events: ?On 5/3 at 2224, 2233, and 2234 hours, she pushes event button for upset stomach. Patient lying in bed, no clinical changes seen. Electrographically, there  were no EEG or EKG changes seen. ? ?There were no electrographic seizures seen.  EKG lead was unremarkable. ? ?IMPRESSION: This 24-hour ambulatory video EEG study is within normal limits. ? ?CLINICAL CORRELATION: A normal EEG does not exclude a clinical diagnosis of epilepsy. Typical events were not captured. Episodes of upset stomach did not show EEG correlate. If further clinical questions remain, inpatient video EEG monitoring may be helpful. ? ? ?Patrcia Dolly, M.D. ? ?

## 2021-10-07 ENCOUNTER — Other Ambulatory Visit: Payer: Self-pay | Admitting: Student

## 2021-10-07 DIAGNOSIS — R140 Abdominal distension (gaseous): Secondary | ICD-10-CM

## 2021-10-07 DIAGNOSIS — R197 Diarrhea, unspecified: Secondary | ICD-10-CM

## 2021-10-12 ENCOUNTER — Telehealth (INDEPENDENT_AMBULATORY_CARE_PROVIDER_SITE_OTHER): Payer: Medicare Other | Admitting: Neurology

## 2021-10-12 ENCOUNTER — Encounter: Payer: Self-pay | Admitting: Neurology

## 2021-10-12 VITALS — Ht 62.0 in | Wt 140.0 lb

## 2021-10-12 DIAGNOSIS — G40209 Localization-related (focal) (partial) symptomatic epilepsy and epileptic syndromes with complex partial seizures, not intractable, without status epilepticus: Secondary | ICD-10-CM
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
## 2021-11-01 ENCOUNTER — Other Ambulatory Visit: Payer: Medicare Other

## 2021-11-07 ENCOUNTER — Ambulatory Visit (HOSPITAL_COMMUNITY): Payer: Medicare Other | Admitting: Psychiatry

## 2022-01-25 DIAGNOSIS — Z79899 Other long term (current) drug therapy: Secondary | ICD-10-CM | POA: Diagnosis not present

## 2022-02-08 DIAGNOSIS — Z79899 Other long term (current) drug therapy: Secondary | ICD-10-CM | POA: Diagnosis not present

## 2022-02-22 DIAGNOSIS — Z79899 Other long term (current) drug therapy: Secondary | ICD-10-CM | POA: Diagnosis not present

## 2022-03-08 DIAGNOSIS — Z79899 Other long term (current) drug therapy: Secondary | ICD-10-CM | POA: Diagnosis not present

## 2022-03-23 DIAGNOSIS — Z79899 Other long term (current) drug therapy: Secondary | ICD-10-CM | POA: Diagnosis not present

## 2022-04-06 DIAGNOSIS — Z79899 Other long term (current) drug therapy: Secondary | ICD-10-CM | POA: Diagnosis not present

## 2022-04-19 DIAGNOSIS — Z79899 Other long term (current) drug therapy: Secondary | ICD-10-CM | POA: Diagnosis not present

## 2022-04-26 DIAGNOSIS — R69 Illness, unspecified: Secondary | ICD-10-CM | POA: Diagnosis not present

## 2022-04-26 DIAGNOSIS — F209 Schizophrenia, unspecified: Secondary | ICD-10-CM | POA: Diagnosis not present

## 2022-05-03 ENCOUNTER — Other Ambulatory Visit: Payer: Self-pay | Admitting: Gastroenterology

## 2022-05-03 DIAGNOSIS — Z79899 Other long term (current) drug therapy: Secondary | ICD-10-CM | POA: Diagnosis not present

## 2022-05-05 ENCOUNTER — Ambulatory Visit: Payer: Medicare HMO | Admitting: Neurology

## 2022-05-05 ENCOUNTER — Encounter: Payer: Self-pay | Admitting: Neurology

## 2022-05-05 VITALS — BP 128/79 | HR 113 | Resp 20 | Ht 62.0 in | Wt 160.0 lb

## 2022-05-05 DIAGNOSIS — Z87898 Personal history of other specified conditions: Secondary | ICD-10-CM | POA: Diagnosis not present
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
## 2022-05-18 DIAGNOSIS — Z79899 Other long term (current) drug therapy: Secondary | ICD-10-CM | POA: Diagnosis not present

## 2022-05-20 DIAGNOSIS — Z6829 Body mass index (BMI) 29.0-29.9, adult: Secondary | ICD-10-CM | POA: Diagnosis not present

## 2022-05-20 DIAGNOSIS — H66011 Acute suppurative otitis media with spontaneous rupture of ear drum, right ear: Secondary | ICD-10-CM | POA: Diagnosis not present

## 2022-05-20 DIAGNOSIS — B379 Candidiasis, unspecified: Secondary | ICD-10-CM | POA: Diagnosis not present

## 2022-05-20 DIAGNOSIS — T3695XA Adverse effect of unspecified systemic antibiotic, initial encounter: Secondary | ICD-10-CM | POA: Diagnosis not present

## 2022-10-04 ENCOUNTER — Ambulatory Visit: Payer: Medicare HMO | Admitting: Neurology

## 2022-10-04 ENCOUNTER — Encounter: Payer: Self-pay | Admitting: Neurology

## 2022-10-04 VITALS — BP 122/78 | HR 78 | Ht 62.0 in | Wt 163.4 lb

## 2022-10-04 DIAGNOSIS — R200 Anesthesia of skin: Secondary | ICD-10-CM | POA: Diagnosis not present

## 2022-10-04 DIAGNOSIS — H538 Other visual disturbances: Secondary | ICD-10-CM | POA: Diagnosis not present

## 2022-10-04 DIAGNOSIS — Z87898 Personal history of other specified conditions: Secondary | ICD-10-CM

## 2022-10-04 NOTE — Patient Instructions (Signed)
Good to see you.  Continue to monitor the left arm numbness. If no improvement or worsening, we can order the nerve and muscle test  2. Please see your eye doctor for the blurred vision  3. Follow-up in 6 months, call for any changes
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
## 2022-12-12 ENCOUNTER — Telehealth (HOSPITAL_COMMUNITY): Payer: Self-pay

## 2022-12-12 NOTE — Telephone Encounter (Signed)
TMS C: Received digital referral via email from patient's therapist, Alyce Bitticks.  TMS C: Responded to email stating that the patient does not qualify for TMS treatment due to dx criteria. Relayed that patient attempted to receive treatment back in 2016 and was denied. Provided another option of treatment through ECT as patient went for a consult back in 2021, but declined moving forward with process. Offered additional assistance if needed.

## 2022-12-12 NOTE — Telephone Encounter (Signed)
TMS C: Received incoming call from pt requesting more info for TMS services. Pt mentioned their therapist (Alyce Bitticks, [facility]) recommended treatment through [facility]. Pt stated they received TMS treatment about 3 years ago through external facility.   [employer] Coordinator requested pt to call their therapist and have them submit electronic referral to outpatient office (phone number & fax provided) to have access to pertinent info. Once completed, would call back to initiate phone screening.

## 2022-12-25 ENCOUNTER — Telehealth (HOSPITAL_COMMUNITY): Payer: Self-pay

## 2022-12-25 NOTE — Telephone Encounter (Signed)
TMS C: Received incoming call from pt. Coordinator alerted pt that decision had been provided to referring provider, pt unaware. Coordinator informed pt that due to certain diagnostics/risks in pt chart (re: seizure dx and schizophrenia dx), they are ineligible for TMS. Coordinator stated additional resources & advised pt that ECT would be a better course of treatment d/t reduced risk. Pt stated they had already tried ECT. Pt had no additional concerns/comments/questions before concluding call.

## 2023-03-17 ENCOUNTER — Emergency Department (HOSPITAL_COMMUNITY): Payer: Medicare HMO

## 2023-03-17 ENCOUNTER — Encounter (HOSPITAL_COMMUNITY): Payer: Self-pay

## 2023-03-17 ENCOUNTER — Other Ambulatory Visit: Payer: Self-pay

## 2023-03-17 ENCOUNTER — Emergency Department (HOSPITAL_COMMUNITY)
Admission: EM | Admit: 2023-03-17 | Discharge: 2023-03-17 | Disposition: A | Discharge status: Home / Self Care | Payer: Medicare HMO | Attending: Emergency Medicine | Admitting: Emergency Medicine

## 2023-03-17 DIAGNOSIS — W1830XA Fall on same level, unspecified, initial encounter: Secondary | ICD-10-CM | POA: Insufficient documentation

## 2023-03-17 DIAGNOSIS — F410 Panic disorder [episodic paroxysmal anxiety] without agoraphobia: Secondary | ICD-10-CM | POA: Insufficient documentation

## 2023-03-17 DIAGNOSIS — R569 Unspecified convulsions: Secondary | ICD-10-CM | POA: Insufficient documentation

## 2023-03-17 DIAGNOSIS — S82832A Other fracture of upper and lower end of left fibula, initial encounter for closed fracture: Secondary | ICD-10-CM | POA: Insufficient documentation

## 2023-03-17 DIAGNOSIS — M25562 Pain in left knee: Secondary | ICD-10-CM | POA: Diagnosis present

## 2023-03-17 LAB — CBC
HCT: 41.8 % (ref 36.0–46.0)
Hemoglobin: 13.8 g/dL (ref 12.0–15.0)
MCH: 29.3 pg (ref 26.0–34.0)
MCHC: 33 g/dL (ref 30.0–36.0)
MCV: 88.7 fL (ref 80.0–100.0)
Platelets: 247 10*3/uL (ref 150–400)
RBC: 4.71 MIL/uL (ref 3.87–5.11)
RDW: 12.4 % (ref 11.5–15.5)
WBC: 6.8 10*3/uL (ref 4.0–10.5)
nRBC: 0 % (ref 0.0–0.2)

## 2023-03-17 LAB — URINALYSIS, ROUTINE W REFLEX MICROSCOPIC
Bilirubin Urine: NEGATIVE
Glucose, UA: NEGATIVE mg/dL
Ketones, ur: NEGATIVE mg/dL
Leukocytes,Ua: NEGATIVE
Nitrite: NEGATIVE
Protein, ur: 30 mg/dL — AB
Specific Gravity, Urine: 1.02 (ref 1.005–1.030)
pH: 5 (ref 5.0–8.0)

## 2023-03-17 LAB — BASIC METABOLIC PANEL
Anion gap: 12 (ref 5–15)
BUN: 10 mg/dL (ref 6–20)
CO2: 23 mmol/L (ref 22–32)
Calcium: 9.3 mg/dL (ref 8.9–10.3)
Chloride: 105 mmol/L (ref 98–111)
Creatinine, Ser: 1.2 mg/dL — ABNORMAL HIGH (ref 0.44–1.00)
GFR, Estimated: >60 mL/min (ref >60)
Glucose, Bld: 138 mg/dL — ABNORMAL HIGH (ref 70–99)
Potassium: 4.1 mmol/L (ref 3.5–5.1)
Sodium: 140 mmol/L (ref 135–145)

## 2023-03-17 LAB — HEPATIC FUNCTION PANEL
ALT: 20 U/L (ref 0–44)
AST: 31 U/L (ref 15–41)
Albumin: 4 g/dL (ref 3.5–5.0)
Alkaline Phosphatase: 85 U/L (ref 38–126)
Bilirubin, Direct: <0.1 mg/dL (ref 0.0–0.2)
Total Bilirubin: 0.5 mg/dL (ref 0.3–1.2)
Total Protein: 7 g/dL (ref 6.5–8.1)

## 2023-03-17 LAB — RAPID URINE DRUG SCREEN, HOSP PERFORMED
Amphetamines: NOT DETECTED
Barbiturates: NOT DETECTED
Benzodiazepines: POSITIVE — AB
Cocaine: NOT DETECTED
Opiates: NOT DETECTED
Tetrahydrocannabinol: NOT DETECTED

## 2023-03-17 LAB — MAGNESIUM: Magnesium: 2.4 mg/dL (ref 1.7–2.4)

## 2023-03-17 LAB — HCG, SERUM, QUALITATIVE: Preg, Serum: NEGATIVE

## 2023-03-17 NOTE — Discharge Instructions (Addendum)
No driving for 6 months or until cleared by your neurologist.

## 2023-03-17 NOTE — Progress Notes (Signed)
Orthopedic Tech Progress Note Patient Details:  [name] Jan 27, 1991 [id number]  Ortho Devices Type of Ortho Device: Knee Immobilizer Ortho Device/Splint Location: RLE (when walking) Ortho Device/Splint Interventions: Ordered, Application, Adjustment   Post Interventions Patient Tolerated: Well Instructions Provided: Care of device, Adjustment of device  [name] Carmine Savoy 03/17/2023, 12:55 PM

## 2023-03-17 NOTE — ED Triage Notes (Signed)
Pt c.o several panic attacks last night leading up to a seizure. Pt states she was putting all her medications in their containers when the next thing she knew she woke up and was standing up with the table turned over, pills everywhere and then her panic attacks started. Pt has hx of seizures, last one was a few years ago. States she has had a lot of stress recently. Pt denies headache or dizziness at this time but c.o left posterior knee pain from the incident yesterday.

## 2023-03-17 NOTE — ED Provider Notes (Signed)
[place] EMERGENCY DEPARTMENT AT [facility] Provider Note   CSN: [id number] Arrival date & time: 03/17/23  4098     History  Chief Complaint  Patient presents with   Seizures   Panic Attack    Margaret Farley is a 32 y.o. female.  Pt is a 32 yo female with pmhx significant for anxiety, seizure d/o, schizophrenia, hld, and gerd.  Pt has been having increased anxiety over the last few days.  She had several panic attacks last night and thinks she had a seizure.  She has not had a seizure since 2020 and has been off seizure meds for years b/c it has been so long.  She regularly follows with Dr. Oswaldo Done (neuro).  She denies biting her tongue or urinary incontinence.  She did knock over her bedside table and spilled all her meds.  She is not sure if she took the correct evening meds, but she slept well and woke up this am with some left knee pain from the fall.  She does not feel that she needs to see psych now.         Home Medications Prior to Admission medications   Medication Sig Start Date End Date Taking? Authorizing Provider  ALPRAZolam Prudy Feeler) 1 MG tablet Take 1 mg by mouth 3 (three) times daily as needed for anxiety. Taking 4 times a day    [provider]  cloZAPine (CLOZARIL) 100 MG tablet Take 3.5 tablets by mouth at bedtime. 02/14/21   [provider]  ondansetron (ZOFRAN) 4 MG tablet Take 4 mg by mouth 3 (three) times daily as needed. 03/28/21   [provider]  QUEtiapine (SEROQUEL) 100 MG tablet Take 200 mg by mouth 2 (two) times daily.  06/12/18   [provider]  sertraline (ZOLOFT) 100 MG tablet Take 100 mg by mouth daily. 02/08/21   [provider]      Allergies    Benadryl allergy [diphenhydramine]    Review of Systems   Review of Systems  Musculoskeletal:        Left knee pain  Neurological:  Positive for seizures.  Psychiatric/Behavioral:  The patient is nervous/anxious.   All other systems reviewed and  are negative.   Physical Exam Updated Vital Signs BP 117/80   Pulse 99   Temp 98.3 F (36.8 C)   Resp 14   Ht 5\' 2"  (1.575 m)   Wt 72.6 kg   LMP 03/16/2023   SpO2 100%   BMI 29.26 kg/m  Physical Exam Vitals and nursing note reviewed.  Constitutional:      Appearance: Normal appearance.  HENT:     Head: Normocephalic and atraumatic.     Right Ear: External ear normal.     Left Ear: External ear normal.     Nose: Nose normal.     Mouth/Throat:     Mouth: Mucous membranes are moist.     Pharynx: Oropharynx is clear.  Eyes:     Extraocular Movements: Extraocular movements intact.     Conjunctiva/sclera: Conjunctivae normal.     Pupils: Pupils are equal, round, and reactive to light.  Cardiovascular:     Rate and Rhythm: Normal rate and regular rhythm.     Pulses: Normal pulses.     Heart sounds: Normal heart sounds.  Pulmonary:     Effort: Pulmonary effort is normal.     Breath sounds: Normal breath sounds.  Abdominal:     General: Abdomen is flat. Bowel  sounds are normal.     Palpations: Abdomen is soft.  Musculoskeletal:     Cervical back: Normal range of motion and neck supple.       Legs:  Skin:    General: Skin is warm.     Capillary Refill: Capillary refill takes less than 2 seconds.  Neurological:     General: No focal deficit present.     Mental Status: She is alert and oriented to person, place, and time.  Psychiatric:        Mood and Affect: Mood normal.        Behavior: Behavior normal.     ED Results / Procedures / Treatments   Labs (all labs ordered are listed, but only abnormal results are displayed) Labs Reviewed  BASIC METABOLIC PANEL - Abnormal; Notable for the following components:      Result Value   Glucose, Bld 138 (*)    Creatinine, Ser 1.20 (*)    All other components within normal limits  URINALYSIS, ROUTINE W REFLEX MICROSCOPIC - Abnormal; Notable for the following components:   APPearance HAZY (*)    Hgb urine dipstick  MODERATE (*)    Protein, ur 30 (*)    Bacteria, UA RARE (*)    All other components within normal limits  RAPID URINE DRUG SCREEN, HOSP PERFORMED - Abnormal; Notable for the following components:   Benzodiazepines POSITIVE (*)    All other components within normal limits  HCG, SERUM, QUALITATIVE  CBC  HEPATIC FUNCTION PANEL  MAGNESIUM    EKG EKG Interpretation Date/Time:  Saturday March 17 2023 11:33:20 EDT Ventricular Rate:  101 PR Interval:  151 QRS Duration:  80 QT Interval:  400 QTC Calculation: 519 R Axis:   31  Text Interpretation: Sinus tachycardia Low voltage, precordial leads Borderline abnrm T, anterolateral leads Prolonged QT interval No significant change since last tracing Confirmed by Jacalyn Lefevre [phone]) on 03/17/2023 11:38:36 AM  Radiology DG Knee Complete 4 Views Left  Result Date: 03/17/2023 CLINICAL DATA:  pain EXAM: LEFT KNEE - COMPLETE 4+ VIEW COMPARISON:  None Available. FINDINGS: There is a nondisplaced fracture of the fibular head. Knee joint alignment is maintained. Soft tissues are unremarkable. IMPRESSION: Nondisplaced fracture of the fibular head. Electronically Signed   By: Meda Klinefelter M.D.   On: 03/17/2023 11:39    Procedures Procedures    Medications Ordered in ED Medications - No data to display  ED Course/ Medical Decision Making/ A&P                                 Medical Decision Making Amount and/or Complexity of Data Reviewed Labs: ordered. Radiology: ordered.   This patient presents to the ED for concern of seizure, this involves an extensive number of treatment options, and is a complaint that carries with it a high risk of complications and morbidity.  The differential diagnosis includes seizure, psychogenic seizure, electrolyte abn, infection   Co morbidities that complicate the patient evaluation  anxiety, seizure d/o, schizophrenia, hld, and gerd   Additional history obtained:  Additional history  obtained from epic chart review External records from outside source obtained and reviewed including mom   Lab Tests:  I Ordered, and personally interpreted labs.  The pertinent results include:  cbc nl, bmp nl, preg neg, lfts nl, ua +protein, uds + bzd   Imaging Studies ordered:  I ordered imaging studies including left knee  I independently visualized and interpreted imaging which showed Nondisplaced fracture of the fibular head.  I agree with the radiologist interpretation   Cardiac Monitoring:  The patient was maintained on a cardiac monitor.  I personally viewed and interpreted the cardiac monitored which showed an underlying rhythm of: nsr   Medicines ordered and prescription drug management:   I have reviewed the patients home medicines and have made adjustments as needed   Test Considered:  xr   Problem List / ED Course:  Panic attack:  pt is no longer feeling anxious.  She has good f/u.  Pt and mom ok with outpatient f/u with her psychiatrist Dr. Evelene Croon. Seizure:  This could be related to her panic attack.  However, she did have documented seizure focus on EEG in the past.  As she has not had any seizures in years, I will hold off on restarting meds.  Pt will f/u with her neurologist, Dr. Karel Jarvis. Left fibular head fx:  pt placed in a knee immobilizer.  She did not want anything for pain.  She is to f/u with ortho.   Reevaluation:  After the interventions noted above, I reevaluated the patient and found that they have :improved   Social Determinants of Health:  Lives at home   Dispostion:  After consideration of the diagnostic results and the patients response to treatment, I feel that the patent would benefit from discharge with outpatient f/u.          Final Clinical Impression(s) / ED Diagnoses Final diagnoses:  Panic attack  Seizure (HCC)  Closed fracture of head of left fibula, initial encounter    Rx / DC Orders ED Discharge Orders      None         Jacalyn Lefevre, MD 03/17/23 1245

## 2023-03-17 NOTE — ED Notes (Signed)
Spoke with lab able to add on hepatic function panel and magnesium.

## 2023-03-19 ENCOUNTER — Telehealth: Payer: Self-pay | Admitting: Neurology

## 2023-03-19 ENCOUNTER — Other Ambulatory Visit: Payer: Self-pay

## 2023-03-19 DIAGNOSIS — Z87898 Personal history of other specified conditions: Secondary | ICD-10-CM

## 2023-03-19 DIAGNOSIS — G40209 Localization-related (focal) (partial) symptomatic epilepsy and epileptic syndromes with complex partial seizures, not intractable, without status epilepticus: Secondary | ICD-10-CM

## 2023-03-19 NOTE — Telephone Encounter (Signed)
Pls order and  have her schedule 1-hour EEG. Will put her on cancellation list for earlier appt with me, thanks

## 2023-03-19 NOTE — Telephone Encounter (Signed)
Sent up front for scheduling

## 2023-03-19 NOTE — Telephone Encounter (Signed)
Caller stated pt had a seizure late Friday afternoon. Had 5 panic attacks leading up to seizure. Has not had a seizure in 4 years

## 2023-03-19 NOTE — Telephone Encounter (Signed)
Pt c/o: seizure Missed medications?  No. Sleep deprived?  No. Alcohol intake?  No. Increased stress? Yes.   moving Any change in medication color or shape? No. Back to their usual baseline self?  Yes.  . If no, advise go to ER Current medications prescribed by Dr. Karel Jarvis: none Called patient she stated she has not have seizure in years but on Friday she had 5 panic attacks which she believes is what brought on the seizure. Patient stated she blacked out and fell hitting her table with all her meds on them. Her mom found her this way and gave her the meds she needed. Patient did not go to ED right away BUT WAITED UNTIL Saturday MORNING TO GO AND GET CHECKED OUT. ED REQUESTING PATIENT SEE DR. Karel Jarvis

## 2023-03-20 ENCOUNTER — Ambulatory Visit: Payer: Medicare HMO | Admitting: Neurology

## 2023-03-20 DIAGNOSIS — Z87898 Personal history of other specified conditions: Secondary | ICD-10-CM

## 2023-03-20 DIAGNOSIS — G40209 Localization-related (focal) (partial) symptomatic epilepsy and epileptic syndromes with complex partial seizures, not intractable, without status epilepticus: Secondary | ICD-10-CM | POA: Diagnosis not present

## 2023-03-20 NOTE — Progress Notes (Signed)
EEG complete - results pending 

## 2023-03-22 ENCOUNTER — Telehealth: Payer: Self-pay | Admitting: Neurology

## 2023-03-22 NOTE — Telephone Encounter (Signed)
Pt's mother called in stating the pt just told her she felt like she was going to have another seizure. She says the feeling has now subsided, but thinks she may need to start taking seizure medication again.

## 2023-03-22 NOTE — Telephone Encounter (Signed)
Pt had 5 panic attacks and a seizure on Friday , she feels like she may have another seizure. She was advised that if she is having seizures she needs to go to the ER, pt mother stated she was just here yesterday for EEG can't we send in medication. I told her no sorry we can't do that she needs to be seen before we can start a medication,

## 2023-03-23 MED ORDER — OXCARBAZEPINE 300 MG PO TABS: 300.0000 mg | ORAL_TABLET | Freq: Two times a day (BID) | ORAL | 6 refills | Status: DC

## 2023-03-23 NOTE — Telephone Encounter (Signed)
Left message with the after hour service on 03-22-23 at 5:35 pm  Caller states that the patient needs anti seizure medication. She states she felt like she is going to have one today

## 2023-03-23 NOTE — Telephone Encounter (Signed)
Pls let them know the EEG was normal. We can restart oxcarbazepine 300mg  BID, pls send in Rx with 6 refills, thanks

## 2023-03-23 NOTE — Telephone Encounter (Signed)
Pt mother who is on DPR called an informed EEG was normal. We can restart oxcarbazepine 300mg  BID,

## 2023-03-23 NOTE — Procedures (Signed)
ELECTROENCEPHALOGRAM REPORT  Date of Study: 03/20/2023  Patient's Name: Margaret Farley MRN: [id number] Date of Birth: 06-13-1990  Referring Provider: Dr. Patrcia Dolly  Clinical History: This is a 32 year old woman with history of seizures, seizure-free since 2020 until recent increase in panic attacks followed by possible seizure. EEG for classification.  Medications: Xanax, Clozaril, Seroquel, Zoloft  Technical Summary: A multichannel digital 1-hour EEG recording measured by the international 10-20 system with electrodes applied with paste and impedances below 5000 ohms performed in our laboratory with EKG monitoring in an awake patient.  Hyperventilation was not performed. Photic stimulation was performed.  The digital EEG was referentially recorded, reformatted, and digitally filtered in a variety of bipolar and referential montages for optimal display.    Description: The patient is awake during the recording.  During maximal wakefulness, there is a symmetric, medium voltage 10 Hz posterior dominant rhythm that attenuates with eye opening.  The record is symmetric.  Sleep was not captured. Photic stimulation did not elicit any abnormalities.  There were no epileptiform discharges or electrographic seizures seen.    EKG lead was unremarkable.  Impression: This 1-hour awake EEG is normal.    Clinical Correlation: A normal EEG does not exclude a clinical diagnosis of epilepsy.  If further clinical questions remain, prolonged EEG may be helpful.  Clinical correlation is advised.   Patrcia Dolly, M.D.

## 2023-03-23 NOTE — Addendum Note (Signed)
Addended by: Dimas Chyle on: 03/23/2023 12:57 PM   Modules accepted: Orders

## 2023-03-30 ENCOUNTER — Encounter: Payer: Self-pay | Admitting: Neurology

## 2023-03-30 ENCOUNTER — Ambulatory Visit: Payer: Medicare HMO | Admitting: Neurology

## 2023-03-30 VITALS — BP 126/90 | HR 108 | Ht 62.0 in | Wt 166.8 lb

## 2023-03-30 DIAGNOSIS — G40209 Localization-related (focal) (partial) symptomatic epilepsy and epileptic syndromes with complex partial seizures, not intractable, without status epilepticus: Secondary | ICD-10-CM

## 2023-03-30 MED ORDER — OXCARBAZEPINE 300 MG PO TABS: ORAL_TABLET | ORAL | 6 refills | Status: DC
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
## 2023-04-05 ENCOUNTER — Telehealth: Payer: Self-pay | Admitting: Neurology

## 2023-04-05 NOTE — Telephone Encounter (Signed)
Please call in a updated RX on the Trileptal that shows the increase that Dr Karel Jarvis did.   [facility] [address]

## 2023-04-05 NOTE — Telephone Encounter (Signed)
Pt is still hearing voices, they are getting unbearable, she is asking when we will do the the EEG, pt pharmacy called  they are working on getting her medication ready it will be ready in an hour

## 2023-04-06 NOTE — Telephone Encounter (Signed)
EEG was scheduled yesterday, looks like it will be done 12/16. She needs to talk to her psychiatrist about the voices. Hopefully she got the Oxcarb that I sent in on her appt day. Thanks

## 2023-04-09 ENCOUNTER — Telehealth: Payer: Self-pay | Admitting: Neurology

## 2023-04-09 NOTE — Telephone Encounter (Signed)
Pt called informed that she may need to give medication time to work . But I would ask and call her back,

## 2023-04-09 NOTE — Telephone Encounter (Signed)
Patients mother called to speak with someone about Margaret Farley's medication. She has took her meds this AM but feels she is going to seize at any moment. Aquino upped her medicine and its not helping her. She doesn't feel like getting up, talking on the phone, nothing. She feels it needs to be changed or upped  Oxcarbazepine 300mg 

## 2023-04-09 NOTE — Telephone Encounter (Signed)
Nida Boatman called to see how far out they are doing EEG's waiting for a call back

## 2023-04-09 NOTE — Telephone Encounter (Signed)
Patient is returning a call to heather 

## 2023-04-09 NOTE — Telephone Encounter (Signed)
Can increase oxcarbazepine 300mg : Take 3 tabs BID, but would not go above this dose until we do the prolonged EEG. Can you check with Nida Boatman if they can do 48-hour EEG earlier? I think she is not scheduled with [facility] until Dec. Thanks

## 2023-04-09 NOTE — Telephone Encounter (Signed)
Pt called an informed that we can increase oxcarbazepine 300mg : Take 3 tabs BID, but would not go above this dose until we do the prolonged EEG.

## 2023-04-09 NOTE — Telephone Encounter (Signed)
Pt called informed that I see the EEG was scheduled yesterday, looks like it will be done 12/16. She needs to talk to her psychiatrist about the voices, pt stated that he talked to psychiatrist they increased med. Pt stated that she got the Oxcarb and she has been taken it,

## 2023-04-10 NOTE — Telephone Encounter (Signed)
Order sent to [facility] for 48 hour EEG

## 2023-04-14 ENCOUNTER — Ambulatory Visit (INDEPENDENT_AMBULATORY_CARE_PROVIDER_SITE_OTHER): Payer: Medicare HMO | Admitting: Neurology

## 2023-04-14 DIAGNOSIS — G40209 Localization-related (focal) (partial) symptomatic epilepsy and epileptic syndromes with complex partial seizures, not intractable, without status epilepticus: Secondary | ICD-10-CM | POA: Diagnosis not present

## 2023-05-04 ENCOUNTER — Telehealth: Payer: Self-pay | Admitting: Neurology

## 2023-05-04 ENCOUNTER — Other Ambulatory Visit: Payer: Self-pay | Admitting: Neurology

## 2023-05-04 NOTE — Telephone Encounter (Signed)
Spoke to patient and mother. She has been doing well with no further seizures or feelings like she would have a seizure since prior to the EEG. Discussed EEG showing occasional left temporal epileptiform discharges, no seizures seen. She is doing well on Oxcarbazepine 900mg  BID except it is increasing her appetite.   They have not heard from open MRI, Heather, pls re-order open MRI brain with and without contrast, diagnosis Epilepsy. Thanks

## 2023-05-07 ENCOUNTER — Other Ambulatory Visit: Payer: Medicare HMO

## 2023-05-07 NOTE — Telephone Encounter (Signed)
MRI order refaxed to [facility]

## 2023-05-08 ENCOUNTER — Telehealth: Payer: Self-pay | Admitting: Neurology

## 2023-05-08 NOTE — Telephone Encounter (Signed)
Pt called in stating she wants to wait until she is done moving to get the MRI. She also doesn't want [place] to call her mother. I told her that her mother is on the DPR, so a new one would need to be signed. She didn't want me to send her a new DPR due to her moving.

## 2023-09-24 ENCOUNTER — Telehealth: Payer: Self-pay | Admitting: Neurology

## 2023-09-24 NOTE — Telephone Encounter (Signed)
 Pt called in and stating her psychiatrist put her on Buspar and she was only on it for 2 days. She had an "almost" seizure the first day she took it and has had one every day since then. She is able to lay down and close her eyes and it goes away or she falls asleep. She is wanting to see if she can take her Oxcarbazepine  at a higher dose?

## 2023-09-25 ENCOUNTER — Telehealth: Payer: Self-pay

## 2023-09-25 ENCOUNTER — Telehealth: Payer: Self-pay | Admitting: Neurology

## 2023-09-25 NOTE — Telephone Encounter (Signed)
 Pt's mother called in to follow up on what she should do about her seizures. Her mother stated they moved out of town and have not gotten a new neurologist yet.

## 2023-09-25 NOTE — Telephone Encounter (Signed)
 Pt c/o: seizure Missed medications?  No. Sleep deprived?  No.No. Alcohol intake? no  Increased stress? yes Any change in medication color or shape? No. Back to their usual baseline self?  No.. If no, advise go to ER Current medications prescribed by Dr. Ty Gales: Oxcarbazepine  she take 5 or 6 pills at one time. Another Dr. Kimble Pennant her on Buspar and  she only took it one day and stopped on April 25th. And she at first felt like she was going to have a seizure, but for the last 2 days it has gotten worst. She has been having panic attacks. Please Advise. She was advise to go to the ED as needed. She understood

## 2023-09-25 NOTE — Telephone Encounter (Signed)
 Pls check how she is taking oxcarbazepine  (2 tabs BID or 3 tabs BID), I have not seen her since November, needs to schedule f/u also especially if we make medication changes. Thanks

## 2023-09-26 ENCOUNTER — Other Ambulatory Visit: Payer: Self-pay

## 2023-09-26 MED ORDER — OXCARBAZEPINE 300 MG PO TABS: ORAL_TABLET | ORAL | 4 refills | Status: DC

## 2023-09-26 NOTE — Telephone Encounter (Signed)
 See other phone note

## 2023-09-26 NOTE — Telephone Encounter (Signed)
 See other phone note,

## 2023-09-26 NOTE — Telephone Encounter (Signed)
Pt is returning heathers call. 

## 2023-09-26 NOTE — Telephone Encounter (Signed)
 Pt called an informed that she needs to take her medication 3 tabs BID NOT 6 tabs in the morning and NOT to take old medication it is not doing her any good she needs to take it to the pharmacy and discard it,

## 2023-09-26 NOTE — Telephone Encounter (Signed)
 Pt called no answer left a voice mail to call the office back

## 2023-09-27 NOTE — Telephone Encounter (Signed)
 Pt's mother called back in stating the pharmacy says they cannot get the ixcarbazepine until 10/07/23. She is confused about the old medication and the new medication. Pattie Borders had told her to stop taking old medication.

## 2023-09-28 NOTE — Telephone Encounter (Signed)
 Pt mother called informed that pt was advised that she needs to stop taken old out of date medication. The out of date medication is not good for her, She was advised that a new RX was sent to the pharmacy and that they should be able to pick it up because its a different dosing, pt mother said that they did pick up the medication and that she did start it.  Pt mother said that she didn't believe she was taken 6 pills at a time but you can't always believe her, she said they are going to transfer neurologist near them.

## 2023-10-25 ENCOUNTER — Ambulatory Visit: Payer: Medicare HMO | Admitting: Neurology

## 2023-12-07 ENCOUNTER — Encounter: Payer: Self-pay | Admitting: Advanced Practice Midwife

## 2024-03-16 ENCOUNTER — Other Ambulatory Visit: Payer: Self-pay | Admitting: Neurology

## 2024-03-17 ENCOUNTER — Telehealth: Payer: Self-pay | Admitting: Neurology

## 2024-03-17 NOTE — Telephone Encounter (Signed)
 Pt has appt and needs refill of Rx Oxcarbazepine  (TRILEPTAL ) 300 MG tablet  [facility] [address] [place]

## 2024-03-17 NOTE — Telephone Encounter (Signed)
 Refill has been sent in for pt, to last until her appointment

## 2024-04-01 ENCOUNTER — Encounter: Payer: Self-pay | Admitting: Neurology

## 2024-06-13 ENCOUNTER — Ambulatory Visit: Admitting: Neurology
